# Patient Record
Sex: Female | Born: 1980 | Race: Black or African American | Hispanic: No | Marital: Married | Smoking: Never smoker
Health system: Southern US, Community
[De-identification: ages and names within clinical notes are randomized; demographics above are authoritative.]

## PROBLEM LIST (undated history)

## (undated) ENCOUNTER — Emergency Department (HOSPITAL_COMMUNITY): Admission: EM | Payer: Self-pay | Source: Home / Self Care

## (undated) DIAGNOSIS — K5909 Other constipation: Secondary | ICD-10-CM

## (undated) DIAGNOSIS — K219 Gastro-esophageal reflux disease without esophagitis: Secondary | ICD-10-CM

## (undated) DIAGNOSIS — G43909 Migraine, unspecified, not intractable, without status migrainosus: Secondary | ICD-10-CM

## (undated) DIAGNOSIS — R1310 Dysphagia, unspecified: Secondary | ICD-10-CM

## (undated) DIAGNOSIS — H18603 Keratoconus, unspecified, bilateral: Secondary | ICD-10-CM

## (undated) HISTORY — PX: UTERINE FIBROID EMBOLIZATION: SHX825

## (undated) HISTORY — PX: CHOLECYSTECTOMY: SHX55

## (undated) HISTORY — PX: REDUCTION MAMMAPLASTY: SUR839

## (undated) HISTORY — DX: Dysphagia, unspecified: R13.10

## (undated) HISTORY — PX: ANKLE SURGERY: SHX546

## (undated) HISTORY — PX: HERNIA REPAIR: SHX51

## (undated) HISTORY — DX: Other constipation: K59.09

## (undated) HISTORY — DX: Keratoconus, unspecified, bilateral: H18.603

## (undated) HISTORY — PX: ABDOMINAL HYSTERECTOMY: SHX81

---

## 2011-04-25 ENCOUNTER — Encounter: Payer: Self-pay | Admitting: Emergency Medicine

## 2011-04-25 ENCOUNTER — Emergency Department (HOSPITAL_COMMUNITY)
Admission: EM | Admit: 2011-04-25 | Discharge: 2011-04-25 | Disposition: A | Discharge status: Home / Self Care | Payer: Medicaid Other | Attending: Emergency Medicine | Admitting: Emergency Medicine

## 2011-04-25 DIAGNOSIS — R05 Cough: Secondary | ICD-10-CM | POA: Insufficient documentation

## 2011-04-25 DIAGNOSIS — J111 Influenza due to unidentified influenza virus with other respiratory manifestations: Secondary | ICD-10-CM

## 2011-04-25 DIAGNOSIS — R509 Fever, unspecified: Secondary | ICD-10-CM | POA: Insufficient documentation

## 2011-04-25 DIAGNOSIS — R059 Cough, unspecified: Secondary | ICD-10-CM | POA: Insufficient documentation

## 2011-04-25 DIAGNOSIS — IMO0001 Reserved for inherently not codable concepts without codable children: Secondary | ICD-10-CM | POA: Insufficient documentation

## 2011-04-25 DIAGNOSIS — R6889 Other general symptoms and signs: Secondary | ICD-10-CM | POA: Insufficient documentation

## 2011-04-25 MED ORDER — GUAIFENESIN ER 1200 MG PO TB12: 1.0000 | ORAL_TABLET | Freq: Two times a day (BID) | ORAL | Status: DC

## 2011-04-25 MED ORDER — IBUPROFEN 800 MG PO TABS: 800.0000 mg | ORAL_TABLET | Freq: Three times a day (TID) | ORAL | Status: AC | PRN

## 2011-04-25 MED ORDER — PROMETHAZINE-DM 6.25-15 MG/5ML PO SYRP: 5.0000 mL | ORAL_SOLUTION | Freq: Four times a day (QID) | ORAL | Status: AC | PRN

## 2011-04-25 NOTE — ED Notes (Signed)
No complaints at present. Voices understanding of instructions given. Walked to check out window.  

## 2011-04-25 NOTE — ED Provider Notes (Signed)
Medical screening examination/treatment/procedure(s) were performed by non-physician practitioner and as supervising physician I was immediately available for consultation/collaboration.   Glyn Zendejas M Berklee Battey, MD 04/25/11 1657 

## 2011-04-25 NOTE — ED Provider Notes (Signed)
History     CSN: [id number] Arrival date & time: 04/25/2011 12:23 PM   First MD Initiated Contact with Patient 04/25/11 1235      No chief complaint on file.   (Consider location/radiation/quality/duration/timing/severity/associated sxs/prior treatment) The history is provided by the patient.   The patient presents with a two-day history of cough, runny nose, nasal congestion, fever and bodyaches.  She denies chest pain, shortness of breath, nausea, vomiting, diarrhea or headache the patient, states she has not tried any medications other than over-the-counter decongestants, which did not seem to help.  History reviewed. No pertinent past medical history.  Past Surgical History  Procedure Date  . Cholecystectomy   . Hernia repair     No family history on file.  History  Substance Use Topics  . Smoking status: Never Smoker   . Smokeless tobacco: Not on file  . Alcohol Use: No    OB History    Grav Para Term Preterm Abortions TAB SAB Ect Mult Living                  Review of Systems All pertinent positives and negatives in the history of present illness  Allergies  Review of patient's allergies indicates no known allergies.  Home Medications  No current outpatient prescriptions on file.  BP 103/66  Pulse 7  Temp(Src) 99.1 F (37.3 C) (Oral)  Resp 22  Wt 168 lb (76.204 kg)  SpO2 98%  LMP 04/02/2011  Physical Exam  Constitutional: She is oriented to person, place, and time. She appears well-developed and well-nourished.  HENT:  Head: Normocephalic and atraumatic.  Eyes: Pupils are equal, round, and reactive to light.  Neck: Normal range of motion. Neck supple.  Cardiovascular: Normal rate, regular rhythm and normal heart sounds.  Exam reveals no gallop and no friction rub.   No murmur heard. Pulmonary/Chest: Effort normal and breath sounds normal. No respiratory distress. She has no wheezes. She has no rales. She exhibits no tenderness.  Abdominal: Soft.  Bowel sounds are normal. There is no tenderness. There is no rebound and no guarding.  Neurological: She is alert and oriented to person, place, and time.  Skin: Skin is warm and dry. No rash noted.    ED Course  Procedures (including critical care time)   Patient has, what appears to be an influenza-like illness based on history of present illness and physical exam findings.  The patients  Daughter is in a daycare and came home with an influenza-like illness last week.  She states, that she has been taking decongestants without help of her symptoms.        MDM  Influenza-like illness.        Carlyle Dolly, PA-C 04/25/11 [phone]

## 2011-10-10 ENCOUNTER — Encounter (HOSPITAL_COMMUNITY): Payer: Self-pay | Admitting: Emergency Medicine

## 2011-10-10 ENCOUNTER — Emergency Department (HOSPITAL_COMMUNITY)
Admission: EM | Admit: 2011-10-10 | Discharge: 2011-10-10 | Disposition: A | Discharge status: Home / Self Care | Payer: Medicaid Other | Attending: Emergency Medicine | Admitting: Emergency Medicine

## 2011-10-10 DIAGNOSIS — N730 Acute parametritis and pelvic cellulitis: Secondary | ICD-10-CM

## 2011-10-10 DIAGNOSIS — N949 Unspecified condition associated with female genital organs and menstrual cycle: Secondary | ICD-10-CM | POA: Insufficient documentation

## 2011-10-10 DIAGNOSIS — N739 Female pelvic inflammatory disease, unspecified: Secondary | ICD-10-CM | POA: Insufficient documentation

## 2011-10-10 LAB — PREGNANCY, URINE: Preg Test, Ur: NEGATIVE

## 2011-10-10 LAB — URINALYSIS, ROUTINE W REFLEX MICROSCOPIC
Bilirubin Urine: NEGATIVE
Ketones, ur: NEGATIVE mg/dL
Nitrite: NEGATIVE
Protein, ur: NEGATIVE mg/dL
Urobilinogen, UA: 1 mg/dL (ref 0.0–1.0)
pH: 6 (ref 5.0–8.0)

## 2011-10-10 LAB — WET PREP, GENITAL
Trich, Wet Prep: NONE SEEN
Yeast Wet Prep HPF POC: NONE SEEN

## 2011-10-10 LAB — URINE MICROSCOPIC-ADD ON

## 2011-10-10 MED ORDER — DOXYCYCLINE HYCLATE 100 MG PO CAPS: 100.0000 mg | ORAL_CAPSULE | Freq: Two times a day (BID) | ORAL | Status: AC

## 2011-10-10 MED ORDER — LIDOCAINE HCL 1 % IJ SOLN
INTRAMUSCULAR | Status: AC
  Administered 2011-10-10: 20 mL
  Filled 2011-10-10: qty 20

## 2011-10-10 MED ORDER — CEFTRIAXONE SODIUM 250 MG IJ SOLR
250.0000 mg | INTRAMUSCULAR | Status: DC
  Administered 2011-10-10: 250 mg via INTRAMUSCULAR
  Filled 2011-10-10: qty 250

## 2011-10-10 MED ORDER — AZITHROMYCIN 250 MG PO TABS
1000.0000 mg | ORAL_TABLET | Freq: Once | ORAL | Status: AC
  Administered 2011-10-10: 1000 mg via ORAL
  Filled 2011-10-10: qty 4

## 2011-10-10 NOTE — ED Notes (Signed)
C/o vaginal irritation, itching, little bit of discharge- white--- was on antibiotics last week.

## 2011-10-10 NOTE — ED Provider Notes (Signed)
History     CSN: [id number]  Arrival date & time 10/10/11  4098   First MD Initiated Contact with Patient 10/10/11 1010      Chief Complaint  Patient presents with  . Vaginal Discharge  . vaginal irritation     (Consider location/radiation/quality/duration/timing/severity/associated sxs/prior treatment) Patient is a 31 y.o. female presenting with vaginal discharge. The history is provided by the patient. No language interpreter was used.  Vaginal Discharge This is a new problem. The current episode started yesterday. The problem occurs constantly. The problem has been gradually worsening. Pertinent negatives include no abdominal pain, fever, nausea or vomiting.    History reviewed. No pertinent past medical history.  Past Surgical History  Procedure Date  . Cholecystectomy   . Hernia repair     No family history on file.  History  Substance Use Topics  . Smoking status: Never Smoker   . Smokeless tobacco: Not on file  . Alcohol Use: No    OB History    Grav Para Term Preterm Abortions TAB SAB Ect Mult Living                  Review of Systems  Constitutional: Negative.  Negative for fever.  HENT: Negative.   Eyes: Negative.   Respiratory: Negative.   Cardiovascular: Negative.   Gastrointestinal: Negative.  Negative for nausea, vomiting and abdominal pain.  Genitourinary: Positive for vaginal discharge and pelvic pain. Negative for dysuria, urgency, flank pain and vaginal bleeding.  Neurological: Negative.   Psychiatric/Behavioral: Negative.   All other systems reviewed and are negative.    Allergies  Review of patient's allergies indicates no known allergies.  Home Medications   Current Outpatient Rx  Name Route Sig Dispense Refill  . DOXYCYCLINE HYCLATE 100 MG PO CAPS Oral Take 1 capsule (100 mg total) by mouth 2 (two) times daily. 20 capsule 0    BP 108/64  Pulse 90  Temp(Src) 98.7 F (37.1 C) (Oral)  Resp 18  Ht 5\' 5"  (1.651 m)  Wt 159 lb  (72.122 kg)  BMI 26.46 kg/m2  SpO2 100%  LMP 09/26/2011  Physical Exam  Nursing note and vitals reviewed. Constitutional: She is oriented to person, place, and time. She appears well-developed and well-nourished.  HENT:  Head: Normocephalic and atraumatic.  Eyes: Conjunctivae and EOM are normal. Pupils are equal, round, and reactive to light.  Neck: Normal range of motion. Neck supple.  Cardiovascular: Normal rate.   Pulmonary/Chest: Effort normal.  Abdominal: Soft.  Genitourinary: Uterus normal. Cervix exhibits motion tenderness, discharge and friability. Right adnexum displays no mass, no tenderness and no fullness. Left adnexum displays no mass, no tenderness and no fullness. No erythema around the vagina. Vaginal discharge found.  Musculoskeletal: Normal range of motion. She exhibits no edema and no tenderness.  Neurological: She is alert and oriented to person, place, and time. She has normal reflexes.  Skin: Skin is warm and dry.  Psychiatric: She has a normal mood and affect.    ED Course  Pelvic exam Date/Time: 10/10/2011 11:21 AM Performed by: Remi Haggard Authorized by: Remi Haggard Consent: Verbal consent obtained. Written consent not obtained. Risks and benefits: risks, benefits and alternatives were discussed Consent given by: patient Patient understanding: patient states understanding of the procedure being performed Patient identity confirmed: verbally with patient, arm band, provided demographic data and hospital-assigned identification number Time out: Immediately prior to procedure a "time out" was called to verify the correct patient, procedure, equipment, support staff and  site/side marked as required. Preparation: Patient was prepped and draped in the usual sterile fashion. Local anesthesia used: no Patient sedated: no Patient tolerance: Patient tolerated the procedure well with no immediate complications.   (including critical care time)  Labs Reviewed   WET PREP, GENITAL - Abnormal; Notable for the following:    Clue Cells Wet Prep HPF POC MODERATE (*)    WBC, Wet Prep HPF POC MANY (*)    All other components within normal limits  URINALYSIS, ROUTINE W REFLEX MICROSCOPIC - Abnormal; Notable for the following:    Color, Urine AMBER (*) BIOCHEMICALS MAY BE AFFECTED BY COLOR   Leukocytes, UA SMALL (*)    All other components within normal limits  URINE MICROSCOPIC-ADD ON - Abnormal; Notable for the following:    Squamous Epithelial / LPF FEW (*)    Bacteria, UA FEW (*)    All other components within normal limits  PREGNANCY, URINE  GC/CHLAMYDIA PROBE AMP, GENITAL   No results found.   1. PID (acute pelvic inflammatory disease)       MDM  Treated for PID in the ER with rocephen and azithromycin.  Rx for doxycycline.  No intercourse until rechecked at [facility] clinic.  Partner needs to be checked as well.  No etoh. Return if nv fever.        Remi Haggard, NP 10/10/11 2023

## 2011-10-10 NOTE — Discharge Instructions (Signed)
Ms Goding we treated you in the ER today for pelvic inflamitory disease.  [facility]  Get the doxycyline filled and start taking today.  Take ibuprofen 800mg  with food for pain.  Return to the ER for severe pain, nausea vomiting or high fever.  Pelvic Inflammatory Disease Pelvic inflammatory disease (PID) is an infection of some, or all, of the female pelvic organs. PID is caused by germs. HOME CARE  Take your medicine as told. Finish them even if you start to feel better.   Only take medicine as told by your doctor.   Do not have sex (intercourse) until the infection is gone.   Tell your sex partner you have PID. Treatment may be needed.   Keep your follow-up appointments.  GET HELP RIGHT AWAY IF:   You have a fever.   You have an increase in belly (abdominal) pain.   You start to get the chills.   You have pain when you pee (urinate).   You are not better after 72 hours.   Your sex partner tells you he or she has an STD.   You throw up (vomit).   You cannot take your medicines.  MAKE SURE YOU:   Understand these instructions.   Will watch your condition.   Will get help right away if you are not doing well or get worse.  Document Released: 08/05/2008 Document Revised: 04/28/2011 Document Reviewed: 09/08/2009 [facility] Patient Information 2012 [place], [place].

## 2011-10-11 NOTE — ED Provider Notes (Signed)
Medical screening examination/treatment/procedure(s) were performed by non-physician practitioner and as supervising physician I was immediately available for consultation/collaboration.   Lyanne Co, MD 10/11/11 1323

## 2012-01-27 ENCOUNTER — Encounter (HOSPITAL_COMMUNITY): Payer: Self-pay | Admitting: Family Medicine

## 2012-01-27 ENCOUNTER — Emergency Department (HOSPITAL_COMMUNITY)
Admission: EM | Admit: 2012-01-27 | Discharge: 2012-01-28 | Disposition: A | Discharge status: Home / Self Care | Payer: Medicaid Other | Attending: Emergency Medicine | Admitting: Emergency Medicine

## 2012-01-27 DIAGNOSIS — R509 Fever, unspecified: Secondary | ICD-10-CM | POA: Insufficient documentation

## 2012-01-27 DIAGNOSIS — N719 Inflammatory disease of uterus, unspecified: Secondary | ICD-10-CM

## 2012-01-27 DIAGNOSIS — Z9889 Other specified postprocedural states: Secondary | ICD-10-CM | POA: Insufficient documentation

## 2012-01-27 DIAGNOSIS — Z9089 Acquired absence of other organs: Secondary | ICD-10-CM | POA: Insufficient documentation

## 2012-01-27 DIAGNOSIS — D259 Leiomyoma of uterus, unspecified: Secondary | ICD-10-CM | POA: Insufficient documentation

## 2012-01-27 LAB — URINE MICROSCOPIC-ADD ON

## 2012-01-27 LAB — COMPREHENSIVE METABOLIC PANEL
Alkaline Phosphatase: 69 U/L (ref 39–117)
BUN: 7 mg/dL (ref 6–23)
Chloride: 98 mEq/L (ref 96–112)
Creatinine, Ser: 0.65 mg/dL (ref 0.50–1.10)
GFR calc Af Amer: >90 mL/min (ref >90)
Glucose, Bld: 98 mg/dL (ref 70–99)
Potassium: 3.6 mEq/L (ref 3.5–5.1)
Total Bilirubin: 0.2 mg/dL — ABNORMAL LOW (ref 0.3–1.2)

## 2012-01-27 LAB — URINALYSIS, ROUTINE W REFLEX MICROSCOPIC
Nitrite: NEGATIVE
Protein, ur: NEGATIVE mg/dL
Urobilinogen, UA: 2 mg/dL — ABNORMAL HIGH (ref 0.0–1.0)

## 2012-01-27 LAB — CBC WITH DIFFERENTIAL/PLATELET
Eosinophils Absolute: 0 10*3/uL (ref 0.0–0.7)
HCT: 31.4 % — ABNORMAL LOW (ref 36.0–46.0)
Hemoglobin: 10.4 g/dL — ABNORMAL LOW (ref 12.0–15.0)
Lymphs Abs: 1.9 10*3/uL (ref 0.7–4.0)
MCH: 26.9 pg (ref 26.0–34.0)
MCHC: 33.1 g/dL (ref 30.0–36.0)
Monocytes Absolute: 0.8 10*3/uL (ref 0.1–1.0)
Monocytes Relative: 11 % (ref 3–12)
Neutrophils Relative %: 62 % (ref 43–77)
RBC: 3.86 MIL/uL — ABNORMAL LOW (ref 3.87–5.11)

## 2012-01-27 LAB — POCT PREGNANCY, URINE: Preg Test, Ur: NEGATIVE

## 2012-01-27 MED ORDER — SODIUM CHLORIDE 0.9 % IV SOLN
Freq: Once | INTRAVENOUS | Status: AC
  Administered 2012-01-27: 23:00:00 via INTRAVENOUS

## 2012-01-27 MED ORDER — ACETAMINOPHEN 325 MG PO TABS
ORAL_TABLET | ORAL | Status: AC
  Filled 2012-01-27: qty 2

## 2012-01-27 MED ORDER — ACETAMINOPHEN 325 MG PO TABS
650.0000 mg | ORAL_TABLET | Freq: Once | ORAL | Status: AC
Start: 2012-01-27 — End: 2012-01-27
  Administered 2012-01-27: 650 mg via ORAL

## 2012-01-27 NOTE — ED Notes (Signed)
Patient states she has had vaginal bleeding, fevers, and chills since she had a fibroid embolization last week.

## 2012-01-27 NOTE — ED Provider Notes (Signed)
History     CSN: [id number]  Arrival date & time 01/27/12  2147   First MD Initiated Contact with Patient 01/27/12 2247      Chief Complaint  Patient presents with  . Fever  . Vaginal Bleeding    (Consider location/radiation/quality/duration/timing/severity/associated sxs/prior treatment) HPI History provided by pt.   Pt had fibro-embolization approx 10d ago at [facility].  Developed fever 2 days later and had persistent vaginal bleeding and constipation.  Was evaluated at [facility] ED, had a CT (pt unsure of results), diagnosed w/ UTI and d/c'd home w/ miralax.  Was not given abx for UTI.  Comes to ED today because no relief w/ miralax, has not had a BM in 2 weeks, persistent, heavy vaginal bleeding and f/c, max temp 102.  Has mid-line lower abdominal pain as well as pain across lower back.  Has had lightheadedness and weakness in her legs.  No other GU sx.  Other abd surgeries include cholecystectomy and hernia repair.    History reviewed. No pertinent past medical history.  Past Surgical History  Procedure Date  . Cholecystectomy   . Hernia repair   . Uterine fibroid embolization     No family history on file.  History  Substance Use Topics  . Smoking status: Never Smoker   . Smokeless tobacco: Not on file  . Alcohol Use: No    OB History    Grav Para Term Preterm Abortions TAB SAB Ect Mult Living                  Review of Systems  All other systems reviewed and are negative.    Allergies  Review of patient's allergies indicates no known allergies.  Home Medications   Current Outpatient Rx  Name Route Sig Dispense Refill  . IBUPROFEN 800 MG PO TABS Oral Take 800 mg by mouth every 8 (eight) hours as needed.    . OXYCODONE-ACETAMINOPHEN 5-325 MG PO TABS Oral Take 1 tablet by mouth every 4 (four) hours as needed. For pain    . POLYETHYLENE GLYCOL 3350 PO PACK Oral Take 17 g by mouth daily.      BP 115/77  Pulse 98  Temp 102.5 F (39.2 C) (Oral)  Resp 20  Ht 5'  5" (1.651 m)  Wt 145 lb (65.772 kg)  BMI 24.13 kg/m2  SpO2 99%  LMP 01/10/2012  Physical Exam  Nursing note and vitals reviewed. Constitutional: She is oriented to person, place, and time. She appears well-developed and well-nourished. No distress.  HENT:  Head: Normocephalic and atraumatic.  Eyes:       Normal appearance  Neck: Normal range of motion.  Cardiovascular: Normal rate and regular rhythm.   Pulmonary/Chest: Effort normal and breath sounds normal. No respiratory distress.  Abdominal: Soft. Bowel sounds are normal. She exhibits no distension and no mass. There is no rebound and no guarding.       Diffuse lower abd ttp.    Genitourinary:       No CVA tenderness.  Nml external genitalia.  Vaginal bleeding.  Cervix closed and appears nml.  No cervical motion tenderess.  R adnexal ttp.   Musculoskeletal: Normal range of motion.  Neurological: She is alert and oriented to person, place, and time.  Skin: Skin is warm and dry. No rash noted.  Psychiatric: She has a normal mood and affect. Her behavior is normal.    ED Course  Procedures (including critical care time)  Labs Reviewed  CBC WITH  DIFFERENTIAL - Abnormal; Notable for the following:    RBC 3.86 (*)     Hemoglobin 10.4 (*)     HCT 31.4 (*)     Platelets 500 (*)     All other components within normal limits  URINALYSIS, ROUTINE W REFLEX MICROSCOPIC - Abnormal; Notable for the following:    APPearance CLOUDY (*)     Hgb urine dipstick MODERATE (*)     Bilirubin Urine SMALL (*)     Ketones, ur TRACE (*)     Urobilinogen, UA 2.0 (*)     Leukocytes, UA SMALL (*)     All other components within normal limits  URINE MICROSCOPIC-ADD ON - Abnormal; Notable for the following:    Squamous Epithelial / LPF FEW (*)     All other components within normal limits  POCT PREGNANCY, URINE  COMPREHENSIVE METABOLIC PANEL  CULTURE, BLOOD (ROUTINE X 2)  CULTURE, BLOOD (ROUTINE X 2)  LACTIC ACID, PLASMA  WET PREP, GENITAL    GC/CHLAMYDIA PROBE AMP, GENITAL   US Transvaginal Non-ob  01/28/2012  *RADIOLOGY REPORT*  Clinical Data: Abdominal pain and heavy bleeding.  Uterine fibroid embolization procedure 16 days ago.  TRANSABDOMINAL AND TRANSVAGINAL ULTRASOUND OF PELVIS Technique:  Both transabdominal and transvaginal ultrasound examinations of the pelvis were performed. Transabdominal technique was performed for global imaging of the pelvis including uterus, ovaries, adnexal regions, and pelvic cul-de-sac.  It was necessary to proceed with endovaginal exam following the transabdominal exam to visualize the uterus and endometrium.  Comparison:  None  Findings:  Technically limited study.  Ovaries are only visualized transabdominally.  Limited visualization of the uterus and vaginally.  Uterus: The uterus is anteverted and measures 14.1 cm length. There is a mass in the region of the upper uterine segment bulging the contour of the uterus and measuring about 9.1 x 0.8 cm.  The mass is homogeneously hyperechoic with central calcification.  This is likely to represent a fibroid.  Endometrium: Limited visualization of the endometrium.  The stripe thickness is measured about 1 mm.  However, in the uterine fundus, there appears to be an ovoid fluid collection within the endometrium.  This could represent hemorrhage, endometrial polyp, or inflammatory or obstructive process. Clinical correlation is recommended.  If additional evaluation is warranted, consider hysterosonogram.  Right ovary:  Right ovary measures 4.7 x 2.9 x 3.2 cm.  Normal follicular changes.  No abnormal adnexal masses.  Left ovary: Left ovary measures 3.5 x 2.5 x 1.8 cm.  Normal follicular changes.  No abnormal adnexal masses.  Other findings: Flow is demonstrated within both ovaries on limited color flow Doppler evaluation.  No free pelvic fluid collections.  IMPRESSION: Large uterine fibroid.  Fluid in the endometrium of nonspecific etiology.  Clinical correlation is  recommended for further evaluation.  Normal appearance of the ovaries.   Original Report Authenticated By: Marlon Pel, M.D.    US Pelvis Complete  01/28/2012  *RADIOLOGY REPORT*  Clinical Data: Abdominal pain and heavy bleeding.  Uterine fibroid embolization procedure 16 days ago.  TRANSABDOMINAL AND TRANSVAGINAL ULTRASOUND OF PELVIS Technique:  Both transabdominal and transvaginal ultrasound examinations of the pelvis were performed. Transabdominal technique was performed for global imaging of the pelvis including uterus, ovaries, adnexal regions, and pelvic cul-de-sac.  It was necessary to proceed with endovaginal exam following the transabdominal exam to visualize the uterus and endometrium.  Comparison:  None  Findings:  Technically limited study.  Ovaries are only visualized transabdominally.  Limited visualization of  the uterus and vaginally.  Uterus: The uterus is anteverted and measures 14.1 cm length. There is a mass in the region of the upper uterine segment bulging the contour of the uterus and measuring about 9.1 x 0.8 cm.  The mass is homogeneously hyperechoic with central calcification.  This is likely to represent a fibroid.  Endometrium: Limited visualization of the endometrium.  The stripe thickness is measured about 1 mm.  However, in the uterine fundus, there appears to be an ovoid fluid collection within the endometrium.  This could represent hemorrhage, endometrial polyp, or inflammatory or obstructive process. Clinical correlation is recommended.  If additional evaluation is warranted, consider hysterosonogram.  Right ovary:  Right ovary measures 4.7 x 2.9 x 3.2 cm.  Normal follicular changes.  No abnormal adnexal masses.  Left ovary: Left ovary measures 3.5 x 2.5 x 1.8 cm.  Normal follicular changes.  No abnormal adnexal masses.  Other findings: Flow is demonstrated within both ovaries on limited color flow Doppler evaluation.  No free pelvic fluid collections.  IMPRESSION: Large  uterine fibroid.  Fluid in the endometrium of nonspecific etiology.  Clinical correlation is recommended for further evaluation.  Normal appearance of the ovaries.   Original Report Authenticated By: Marlon Pel, M.D.      1. Endometritis       MDM  31yo F pt underwent uterine embolization for uterine fibroids 2 weeks ago.  Evaluated at [facility] ER 8/31 for abd pain and heavy vaginal bleeding.  CT abd/pelvis reviewed and findings consistent w/ changes related to embolization, but superimposed infection could not be ruled out.  Also showed enlarged, cystic ovaries. Pt comes to ED today d/t persistent pain, bleeding and constipation.  On exam, febrile, lower abd ttp, vaginal bleeding and R adnexal ttp.  Labs unremarkable.  US shows lg uterine fibroid, fluid in endometrium and nml ovaries.  Likely has iatrogenic endometritis.  She has received IV vanc/zosyn to treat empirically for intra-abd infection.  Consulted Dr. Oneida Arenas in [facility] and he recommends that she be prescribed doxycycline and f/u with her Gyn on Monday.  Pt received bisacodyl PR and lactulose po in ED and had had a BM.  She has miralax at home. Return precautions discussed.         Otilio Miu, [place] 01/28/12 [phone]

## 2012-01-28 ENCOUNTER — Emergency Department (HOSPITAL_COMMUNITY): Payer: Medicaid Other

## 2012-01-28 LAB — GC/CHLAMYDIA PROBE AMP, GENITAL
Chlamydia, DNA Probe: NEGATIVE
GC Probe Amp, Genital: NEGATIVE

## 2012-01-28 LAB — WET PREP, GENITAL: Clue Cells Wet Prep HPF POC: NONE SEEN

## 2012-01-28 MED ORDER — MORPHINE SULFATE 4 MG/ML IJ SOLN
4.0000 mg | Freq: Once | INTRAMUSCULAR | Status: AC
  Administered 2012-01-28: 4 mg via INTRAVENOUS
  Filled 2012-01-28: qty 1

## 2012-01-28 MED ORDER — BISACODYL 10 MG RE SUPP
10.0000 mg | Freq: Once | RECTAL | Status: AC
  Administered 2012-01-28: 10 mg via RECTAL
  Filled 2012-01-28: qty 1

## 2012-01-28 MED ORDER — DOXYCYCLINE HYCLATE 100 MG PO CAPS: 100.0000 mg | ORAL_CAPSULE | Freq: Two times a day (BID) | ORAL | Status: AC

## 2012-01-28 MED ORDER — VANCOMYCIN HCL IN DEXTROSE 1-5 GM/200ML-% IV SOLN
1000.0000 mg | Freq: Once | INTRAVENOUS | Status: AC
  Administered 2012-01-28: 1000 mg via INTRAVENOUS
  Filled 2012-01-28: qty 200

## 2012-01-28 MED ORDER — PIPERACILLIN-TAZOBACTAM 3.375 G IVPB
3.3750 g | Freq: Once | INTRAVENOUS | Status: AC
  Administered 2012-01-28: 3.375 g via INTRAVENOUS
  Filled 2012-01-28: qty 50

## 2012-01-28 MED ORDER — LACTULOSE 10 GM/15ML PO SOLN
20.0000 g | Freq: Once | ORAL | Status: AC
  Administered 2012-01-28: 20 g via ORAL
  Filled 2012-01-28: qty 30

## 2012-01-28 NOTE — ED Provider Notes (Signed)
Medical screening examination/treatment/procedure(s) were conducted as a shared visit with non-physician practitioner(s) and myself.  I personally evaluated the patient during the encounter. Fever and pelvic pain left sided. Old records reviewed. Imaging reviewed. D/w GYN and plan ABx to cover endometritis  Cyanna Neace, MD 01/28/12 2323 

## 2012-01-28 NOTE — ED Notes (Signed)
Patient awaiting ultrasound

## 2012-01-28 NOTE — ED Notes (Signed)
[place], MD at bedside. 

## 2012-01-28 NOTE — ED Notes (Signed)
Patient transported to Ultrasound 

## 2012-01-28 NOTE — ED Provider Notes (Signed)
Medical screening examination/treatment/procedure(s) were conducted as a shared visit with non-physician practitioner(s) and myself.  I personally evaluated the patient during the encounter. Fever and pelvic pain left sided. Old records reviewed. Imaging reviewed. D/w GYN and plan ABx to cover endometritis  Sunnie Nielsen, MD 01/28/12 5621

## 2012-01-28 NOTE — ED Provider Notes (Signed)
7:22 AM Assumed care of patient at change of shift.  Sign out received from [employer], PA-C. Pt with endometritis, IV antibiotics were pending at change of shift, now finished.  Pt reports she is feeling well, ready to go home.  Plan is for d/c home on doxycycline with gyn follow up (Dr Shawnie Pons in [facility]). Pt declines pain or nausea medication for home.    [place], [place] 01/28/12 [phone]

## 2012-01-28 NOTE — ED Notes (Signed)
Returned from ultrasound.

## 2012-01-28 NOTE — ED Notes (Signed)
Patient states that she had uterine fibroid embolization at [facility] last week. States that she has been running fevers, having chills and having vaginal bleeding since that time.

## 2012-02-03 LAB — CULTURE, BLOOD (ROUTINE X 2): Culture: NO GROWTH

## 2013-12-16 ENCOUNTER — Encounter (HOSPITAL_COMMUNITY): Payer: Self-pay | Admitting: Emergency Medicine

## 2013-12-16 ENCOUNTER — Emergency Department (HOSPITAL_COMMUNITY): Payer: Medicaid Other

## 2013-12-16 ENCOUNTER — Emergency Department (HOSPITAL_COMMUNITY)
Admission: EM | Admit: 2013-12-16 | Discharge: 2013-12-16 | Disposition: A | Discharge status: Home / Self Care | Payer: Medicaid Other | Attending: Emergency Medicine | Admitting: Emergency Medicine

## 2013-12-16 DIAGNOSIS — W108XXA Fall (on) (from) other stairs and steps, initial encounter: Secondary | ICD-10-CM | POA: Insufficient documentation

## 2013-12-16 DIAGNOSIS — M25511 Pain in right shoulder: Secondary | ICD-10-CM

## 2013-12-16 DIAGNOSIS — Y9389 Activity, other specified: Secondary | ICD-10-CM | POA: Diagnosis not present

## 2013-12-16 DIAGNOSIS — R519 Headache, unspecified: Secondary | ICD-10-CM

## 2013-12-16 DIAGNOSIS — Y929 Unspecified place or not applicable: Secondary | ICD-10-CM | POA: Diagnosis not present

## 2013-12-16 DIAGNOSIS — S46909A Unspecified injury of unspecified muscle, fascia and tendon at shoulder and upper arm level, unspecified arm, initial encounter: Secondary | ICD-10-CM | POA: Diagnosis not present

## 2013-12-16 DIAGNOSIS — S0993XA Unspecified injury of face, initial encounter: Secondary | ICD-10-CM | POA: Insufficient documentation

## 2013-12-16 DIAGNOSIS — M62838 Other muscle spasm: Secondary | ICD-10-CM

## 2013-12-16 DIAGNOSIS — S4980XA Other specified injuries of shoulder and upper arm, unspecified arm, initial encounter: Secondary | ICD-10-CM | POA: Insufficient documentation

## 2013-12-16 DIAGNOSIS — S0990XA Unspecified injury of head, initial encounter: Secondary | ICD-10-CM | POA: Diagnosis not present

## 2013-12-16 DIAGNOSIS — S199XXA Unspecified injury of neck, initial encounter: Secondary | ICD-10-CM | POA: Diagnosis not present

## 2013-12-16 DIAGNOSIS — R51 Headache: Secondary | ICD-10-CM

## 2013-12-16 MED ORDER — DIPHENHYDRAMINE HCL 50 MG/ML IJ SOLN: 25.0000 mg | Freq: Once | INTRAMUSCULAR | Status: DC

## 2013-12-16 MED ORDER — SODIUM CHLORIDE 0.9 % IV BOLUS (SEPSIS): 1000.0000 mL | Freq: Once | INTRAVENOUS | Status: DC

## 2013-12-16 MED ORDER — DIPHENHYDRAMINE HCL 25 MG PO CAPS
25.0000 mg | ORAL_CAPSULE | Freq: Once | ORAL | Status: AC
Start: 2013-12-16 — End: 2013-12-16
  Administered 2013-12-16: 25 mg via ORAL
  Filled 2013-12-16: qty 1

## 2013-12-16 MED ORDER — OXYCODONE-ACETAMINOPHEN 5-325 MG PO TABS: 1.0000 | ORAL_TABLET | Freq: Four times a day (QID) | ORAL | Status: DC | PRN

## 2013-12-16 MED ORDER — DIAZEPAM 5 MG/ML IJ SOLN
2.5000 mg | Freq: Once | INTRAMUSCULAR | Status: DC
  Filled 2013-12-16: qty 2

## 2013-12-16 MED ORDER — METOCLOPRAMIDE HCL 5 MG/ML IJ SOLN
10.0000 mg | Freq: Once | INTRAMUSCULAR | Status: DC
  Filled 2013-12-16: qty 2

## 2013-12-16 MED ORDER — KETOROLAC TROMETHAMINE 30 MG/ML IJ SOLN
30.0000 mg | Freq: Once | INTRAMUSCULAR | Status: DC
  Filled 2013-12-16: qty 1

## 2013-12-16 MED ORDER — KETOROLAC TROMETHAMINE 30 MG/ML IJ SOLN
30.0000 mg | Freq: Once | INTRAMUSCULAR | Status: AC
  Administered 2013-12-16: 30 mg via INTRAMUSCULAR

## 2013-12-16 MED ORDER — DEXAMETHASONE SODIUM PHOSPHATE 10 MG/ML IJ SOLN
10.0000 mg | Freq: Once | INTRAMUSCULAR | Status: DC
  Filled 2013-12-16: qty 1

## 2013-12-16 MED ORDER — DIAZEPAM 5 MG PO TABS
5.0000 mg | ORAL_TABLET | Freq: Once | ORAL | Status: AC
  Administered 2013-12-16: 5 mg via ORAL
  Filled 2013-12-16: qty 1

## 2013-12-16 MED ORDER — DEXAMETHASONE SODIUM PHOSPHATE 10 MG/ML IJ SOLN
10.0000 mg | Freq: Once | INTRAMUSCULAR | Status: AC
  Administered 2013-12-16: 10 mg via INTRAMUSCULAR

## 2013-12-16 MED ORDER — METOCLOPRAMIDE HCL 10 MG PO TABS
10.0000 mg | ORAL_TABLET | Freq: Once | ORAL | Status: AC
  Administered 2013-12-16: 10 mg via ORAL
  Filled 2013-12-16: qty 1

## 2013-12-16 NOTE — ED Provider Notes (Signed)
CSN: [id number]     Arrival date & time 12/16/13  1120 History   First MD Initiated Contact with Patient 12/16/13 1148     Chief Complaint  Patient presents with  . Headache  . right sided pain   . Fall   HPI Comments: Patient is a 33 y.o. Female who presents to the ED with headache and right neck and shoulder pain.  Patient states that approximately five days ago she fell down the stairs after hitting her head on the stair banister.  Patient states that she did not lose consciousness when she fell.  She states that she fell because she was really weak after her hernia surgery which was performed on the 20th.  Patient describes her headache as generalized and throbbing.  Headache is a 7/10 pain which does not radiate.  Patient denies any visual changes, photophobia, numbness, tingling, weakness, syncope, changes in hearing, abdominal pain, back pain, loss of bowel or bladder, saddle anesthesia.  Patient states that she has a constant aching pain in her neck, trapezius, and right shoulder.  Pain does not radiate down the arm.  She has been able to ambulate since then.  She has been taking desaryl, hydrocodone, and ibuprofen with no relief.  She denies any history of IV drug use.  Patient is a 33 y.o. female presenting with headaches and fall. The history is provided by the patient. No language interpreter was used.  Headache Associated symptoms: myalgias, nausea, neck pain and neck stiffness   Associated symptoms: no abdominal pain, no back pain, no diarrhea, no dizziness, no fatigue, no fever, no numbness, no seizures and no vomiting   Fall Associated symptoms include arthralgias, headaches, myalgias, nausea and neck pain. Pertinent negatives include no abdominal pain, chest pain, chills, fatigue, fever, joint swelling, numbness, vomiting or weakness.    History reviewed. No pertinent past medical history. Past Surgical History  Procedure Laterality Date  . Cholecystectomy    . Uterine fibroid  embolization    . Abdominal hysterectomy    . Hernia repair      2012 and July 2015   History reviewed. No pertinent family history. History  Substance Use Topics  . Smoking status: Never Smoker   . Smokeless tobacco: Not on file  . Alcohol Use: No   OB History   Grav Para Term Preterm Abortions TAB SAB Ect Mult Living                 Review of Systems  Constitutional: Negative for fever, chills and fatigue.  Respiratory: Negative for chest tightness and shortness of breath.   Cardiovascular: Negative for chest pain and palpitations.  Gastrointestinal: Positive for nausea. Negative for vomiting, abdominal pain, diarrhea, constipation and blood in stool.  Musculoskeletal: Positive for arthralgias, myalgias, neck pain and neck stiffness. Negative for back pain, gait problem and joint swelling.  Neurological: Positive for headaches. Negative for dizziness, tremors, seizures, syncope, weakness, light-headedness and numbness.  All other systems reviewed and are negative.     Allergies  Review of patient's allergies indicates no known allergies.  Home Medications   Prior to Admission medications   Medication Sig Start Date End Date Taking? Authorizing Provider  diazepam (VALIUM) 5 MG tablet Take 5 mg by mouth every 8 (eight) hours as needed for anxiety.   Yes Historical Provider, MD  HYDROcodone-acetaminophen (NORCO/VICODIN) 5-325 MG per tablet Take 1-2 tablets by mouth every 6 (six) hours as needed for moderate pain.   Yes Historical Provider, MD  BP 116/75  Pulse 66  Temp(Src) 99 F (37.2 C) (Oral)  Resp 16  SpO2 100%  LMP 01/10/2012 Physical Exam  Nursing note and vitals reviewed. Constitutional: She is oriented to person, place, and time. She appears well-developed and well-nourished. No distress.  HENT:  Head: Normocephalic and atraumatic.  Mouth/Throat: Oropharynx is clear and moist. No oropharyngeal exudate.  Eyes: Conjunctivae and EOM are normal. Pupils are  equal, round, and reactive to light. No scleral icterus.  Neck: No JVD present. No thyromegaly present.  Cardiovascular: Normal rate, regular rhythm, normal heart sounds and intact distal pulses.  Exam reveals no gallop and no friction rub.   No murmur heard. Pulmonary/Chest: Effort normal and breath sounds normal. No respiratory distress. She has no wheezes. She has no rales. She exhibits no tenderness.  Abdominal: Soft. Bowel sounds are normal. She exhibits no distension and no mass. There is no tenderness. There is no rebound and no guarding.  6 well healing surgical incisions  Musculoskeletal:       Right shoulder: She exhibits decreased range of motion, tenderness, bony tenderness, pain and spasm. She exhibits no swelling, no effusion, no crepitus, no deformity, no laceration, normal pulse and normal strength.       Cervical back: She exhibits decreased range of motion, tenderness, pain and spasm. She exhibits no bony tenderness, no swelling, no edema, no deformity, no laceration and normal pulse.       Thoracic back: Normal.       Lumbar back: Normal.       Right hand: Normal sensation noted. Decreased sensation is not present in the ulnar distribution, is not present in the medial distribution and is not present in the radial distribution. Normal strength noted. She exhibits no finger abduction, no thumb/finger opposition and no wrist extension trouble.  5/5 internal rotation, 5/5 external rotation, negative empty can.    Lymphadenopathy:    She has no cervical adenopathy.  Neurological: She is alert and oriented to person, place, and time. She has normal strength. No cranial nerve deficit or sensory deficit.  Skin: Skin is warm and dry. She is not diaphoretic.  Psychiatric: She has a normal mood and affect. Her behavior is normal. Judgment and thought content normal.    ED Course  Procedures (including critical care time) Labs Review Labs Reviewed - No data to display  Imaging  Review Dg Shoulder Right  12/16/2013   CLINICAL DATA:  Status post fall 6 days ago with persistent right shoulder pain  EXAM: RIGHT SHOULDER - 2+ VIEW  COMPARISON:  None.  FINDINGS: The bones of the right shoulder are adequately mineralized. There is no acute fracture nor dislocation. The observed portions of the right clavicle and upper right ribs are normal. The overlying soft tissues are unremarkable.  IMPRESSION: There is no acute bony abnormality of the right shoulder.   Electronically Signed   By: David  Martinique   On: 12/16/2013 13:29   Ct Head Wo Contrast  12/16/2013   CLINICAL DATA:  Patient fell 6 days ago, headache  EXAM: CT HEAD WITHOUT CONTRAST  TECHNIQUE: Contiguous axial images were obtained from the base of the skull through the vertex without intravenous contrast.  COMPARISON:  None.  FINDINGS: There is no evidence of mass effect, midline shift or extra-axial fluid collections. There is no evidence of a space-occupying lesion or intracranial hemorrhage. There is no evidence of a cortical-based area of acute infarction.  The ventricles and sulci are appropriate for the patient's  age. The basal cisterns are patent.  Visualized portions of the orbits are unremarkable. The visualized portions of the paranasal sinuses and mastoid air cells are unremarkable.  The osseous structures are unremarkable.  IMPRESSION: Normal CT of the brain without intravenous contrast.   Electronically Signed   By: Kathreen Devoid   On: 12/16/2013 13:38     EKG Interpretation None      MDM   Final diagnoses:  Right shoulder pain  Muscle spasms of neck  Acute nonintractable headache, unspecified headache type   Patient is a 33 y.o. Female who presents to the ED with neck pain, headache, and right shoulder pain.  History is not concerning for cauda equina at this time.  There are no focal neuro deficits on exam. Patient has palpable spasm of the trapezius and right cervical paraspinal muscles.  Rotator cuff  appears to be intact at this time.  Plain film xray of the right shoulder reveals no acute fractures at this time.  CT of the head secondary to trauma shows no acute abnormalities at this time.  Patient was treated with IM injections of toradol, decadron, and given PO benadryl, valium, and reglan here with good relief of symptoms.  Patient is stable for discharge at this time.  Believe that pain is likely due to muscle spasm.  Patient will be discharged home with percocet 5/325.  Patient already has valium at home at this time.  Patient was told to return to the ED for cauda equina symptoms and uncontrollable pain with oral medications at home.  Patient was told to follow-up with her PCP this week.  She states understanding at this time.      Cherylann Parr, PA-C 12/16/13 1623

## 2013-12-16 NOTE — Discharge Instructions (Signed)
Muscle Cramps and Spasms °Muscle cramps and spasms occur when a muscle or muscles tighten and you have no control over this tightening (involuntary muscle contraction). They are a common problem and can develop in any muscle. The most common place is in the calf muscles of the leg. Both muscle cramps and muscle spasms are involuntary muscle contractions, but they also have differences:  °· Muscle cramps are sporadic and painful. They may last a few seconds to a quarter of an hour. Muscle cramps are often more forceful and last longer than muscle spasms. °· Muscle spasms may or may not be painful. They may also last just a few seconds or much longer. °CAUSES  °It is uncommon for cramps or spasms to be due to a serious underlying problem. In many cases, the cause of cramps or spasms is unknown. Some common causes are:  °· Overexertion.   °· Overuse from repetitive motions (doing the same thing over and over).   °· Remaining in a certain position for a long period of time.   °· Improper preparation, form, or technique while performing a sport or activity.   °· Dehydration.   °· Injury.   °· Side effects of some medicines.   °· Abnormally low levels of the salts and ions in your blood (electrolytes), especially potassium and calcium. This could happen if you are taking water pills (diuretics) or you are pregnant.   °Some underlying medical problems can make it more likely to develop cramps or spasms. These include, but are not limited to:  °· Diabetes.   °· Parkinson disease.   °· Hormone disorders, such as thyroid problems.   °· Alcohol abuse.   °· Diseases specific to muscles, joints, and bones.   °· Blood vessel disease where not enough blood is getting to the muscles.   °HOME CARE INSTRUCTIONS  °· Stay well hydrated. Drink enough water and fluids to keep your urine clear or pale yellow. °· It may be helpful to massage, stretch, and relax the affected muscle. °· For tight or tense muscles, use a warm towel, heating  pad, or hot shower water directed to the affected area. °· If you are sore or have pain after a cramp or spasm, applying ice to the affected area may relieve discomfort. °¨ Put ice in a plastic bag. °¨ Place a towel between your skin and the bag. °¨ Leave the ice on for 15-20 minutes, 03-04 times a day. °· Medicines used to treat a known cause of cramps or spasms may help reduce their frequency or severity. Only take over-the-counter or prescription medicines as directed by your caregiver. °SEEK MEDICAL CARE IF:  °Your cramps or spasms get more severe, more frequent, or do not improve over time.  °MAKE SURE YOU:  °· Understand these instructions. °· Will watch your condition. °· Will get help right away if you are not doing well or get worse. °Document Released: 10/29/2001 Document Revised: 09/03/2012 Document Reviewed: 04/25/2012 °ExitCare® Patient Information ©2015 [employer]. This information is not intended to replace advice given to you by your health care provider. Make sure you discuss any questions you have with your health care provider. ° ° °Emergency Department Resource Guide °1) Find a Doctor and Pay Out of Pocket °Although you won't have to find out who is covered by your insurance plan, it is a good idea to ask around and get recommendations. You will then need to call the office and see if the doctor you have chosen will accept you as a new patient and what types of options they offer   for patients who are self-pay. Some doctors offer discounts or will set up payment plans for their patients who do not have insurance, but you will need to ask so you aren't surprised when you get to your appointment. ° °2) Contact Your Local Health Department °Not all health departments have doctors that can see patients for sick visits, but many do, so it is worth a call to see if yours does. If you don't know where your local health department is, you can check in your phone book. The CDC also has a tool to help  you locate your state's health department, and many state websites also have listings of all of their local health departments. ° °3) Find a Walk-in Clinic °If your illness is not likely to be very severe or complicated, you may want to try a walk in clinic. These are popping up all over the country in pharmacies, drugstores, and shopping centers. They're usually staffed by nurse practitioners or physician assistants that have been trained to treat common illnesses and complaints. They're usually fairly quick and inexpensive. However, if you have serious medical issues or chronic medical problems, these are probably not your best option. ° °No Primary Care Doctor: °- Call [facility] at  832-8000 - they can help you locate a primary care doctor that  accepts your insurance, provides certain services, etc. °- Physician Referral Service- 1-800-533-3463 ° °Chronic Pain Problems: °Organization         Address  Phone   Notes  °[facility]  (336) 297-2271 Patients need to be referred by their primary care doctor.  ° °Medication Assistance: °Organization         Address  Phone   Notes  °[place] [facility] [address] °[place], [place] [address] (336) 641-8030 --Must be a resident of [place] °-- Must have NO insurance coverage whatsoever (no Medicaid/ Medicare, etc.) °-- The pt. MUST have a primary care doctor that directs their care regularly and follows them in the community °  °[facility]  (866) 331-1348   °[employer]  (888) 892-1162   ° °Agencies that provide inexpensive medical care: °Organization         Address  Phone   Notes  °[facility]  (336) 832-8035   °[facility]    (336) 832-7272   °[facility] [address] °[place], [place] [address] (336) 832-4777   °[facility] [address], °[place] (336) 271-4999   °[facility]    (336) 373-0678   °[facility]    (336) 272-1050   °[facility] ° [address], [place] Phone:  (336) 832-4444, Fax:  (336) 832-4440 Hours of Operation:  9 am - 6 pm, M-F.  Also accepts Medicaid/Medicare and self-pay.  °[facility] ° [address], [place] Phone: (336) 832-3150, Fax: (336) [phone]. Hours of Operation:  8:30 am - 5:30 pm, M-F.  Also accepts Medicaid and self-pay.  °[facility] [place] [address], [place] Phone: (336) 878-6027   °[facility] [address], [place], [place] (336)723-1848, Ext. 123 Mondays & Thursdays: 7-9 AM.  First 15 patients are seen on a first come, first serve basis. °  ° °Medicaid-accepting [place] Providers: ° °Organization         Address  Phone   Notes  °[facility]   [address], [place] (336) 641-2100 Also accepts self-pay patients.  °[facility] [address], [place] ° (336) 856-9996   °[facility] [address], [place] (336) 288-8857   °[facility] [address] [place] [address], [place] (336) 299-7000   °Veita Bland [address], [place]  ° (336) 373-1557 Only accepts [place] [employer] Medicaid patients after they have their name applied to their card.  ° °Self-Pay (no insurance) in [place]: ° °Organization         Address  Phone   Notes  °Sickle Cell Patients, [facility] [address], [place] (336) 832-1970   °[facility] [address], [place] (336) 832-4400   °[facility] ° [address], [place] (336) 992-4800   °[facility]/Dr. Osei-Bonsu ° [address] [place] [address], [place] or [address], [place] (336) 841-8500 Phone number for both [place] and [place] locations is the same.  °[facility] [address],  [place] (336) 299-0000   °[facility] [address] [place] [address], [place] or [address] (336) 852-7530 °(336) 878-2260   °[facility] [address], [place] (336) 350-1642, phone; (336) 294-5005, fax Sees patients 1st and 3rd Saturday of every month.  Must not qualify for public or private insurance (i.e. Medicaid, Medicare, [place] Health Choice, Veterans' Benefits) • Household income should be no more than 200% of the poverty level •The clinic cannot treat you if you are pregnant or think you are pregnant • Sexually transmitted diseases are not treated at the clinic.  ° ° °Dental Care: °Organization         Address  Phone  Notes  °[facility] [address], [place] (336) 641-6152 Accepts children up to age 21 who are enrolled in Medicaid or [place] Health Choice; pregnant women with a Medicaid card; and children who have applied for Medicaid or [place] Health Choice, but were declined, whose parents can pay a reduced fee at time of service.  °[facility] [place]  [address], [place] (336) 641-7733 Accepts children up to age 21 who are enrolled in Medicaid or [place] Health Choice; pregnant women with a Medicaid card; and children who have applied for Medicaid or [place] Health Choice, but were declined, whose parents can pay a reduced fee at time of service.  °[facility] ° [address], [place] (336) 641-4533 Patients are seen by appointment only. Walk-ins are not accepted. [facility] will see patients 18 years of age and older. °Monday - Tuesday (8am-5pm) °Most Wednesdays (8:30-5pm) °$30 per visit, cash only  °[facility] ° [address], [place] (336) 641-4533 Patients are seen by appointment only. Walk-ins are not accepted. [facility] will see patients 18 years of age and older. °One Wednesday Evening  (Monthly: Volunteer Based).  $30 per visit, cash only  °[facility]  (919) 537-3737 for adults; Children under age 4, call Graduate Pediatric Dentistry at (919) [phone]. Children aged 4-14, please call (919) [phone] to request a pediatric application. ° Dental services are provided in all areas of dental care including fillings, crowns and bridges, complete and partial dentures, implants, gum   treatment, root canals, and extractions. Preventive care is also provided. Treatment is provided to both adults and children. °Patients are selected via a lottery and there is often a waiting list. °  °[facility] [address], °[place] ° (336) 763-8833 [email or web address] °  °[facility] [address], [place], [place] (336)723-1848, Ext. 123 Second and Fourth Thursday of each month, opens at 6:30 AM; Clinic ends at 9 AM.  Patients are seen on a first-come first-served basis, and a limited number are seen during each clinic.  ° °[facility] ° [address], [place], [place] (336) 723-7904   Eligibility Requirements °You must have lived in [place], [place], or [place] counties for at least the last three months. °  You cannot be eligible for state or federal sponsored healthcare insurance, including Veterans Administration, Medicaid, or Medicare. °  You generally cannot be eligible for healthcare insurance through your employer.  °  How to apply: °Eligibility screenings are held every Tuesday and Wednesday afternoon from 1:00 pm until 4:00 pm. You do not need an appointment for the interview!  °[facility] [address], [place], [place] 336-631-2330   °[facility]  336-342-8273   °[facility]  336-703-3100   °[facility]  336-570-6415   ° °Behavioral Health Resources in the Community: °Intensive Outpatient Programs °Organization         Address  Phone  Notes  °[place]  [facility] [address], [place], [place] 336-878-6098   °[facility] [address], [place], [place] 336-832-9800   °[facility] [address], [place], [place] ° 336-882-2125   °[facility] [address],  °[place], [place] 1-800-853-5163 or 336-641-4981   °Substance Abuse Resources °Organization         Address  Phone  Notes  °[facility]  336-882-2125   °[facility]  336-784-9470   °[facility]  336-285-9073   °[facility]  336-845-3988   °Residential & Outpatient Substance Abuse Program  1-800-659-3381   °Psychological Services °Organization         Address  Phone  Notes  °[facility]  336- 832-9600   °[facility]  336- 378-7881   °[facility] [address], [place] 1-800-853-5163 or 336-641-4981   ° °Mobile Crisis Teams °Organization         Address  Phone  Notes  °[facility], Mobile Crisis Care Unit  1-877-626-1772   °[facility] °[facility] ° [address] [place], [place] 336-834-9664   °Sharon DeEsch [address] °[place] 336-554-5454   ° °Self-Help/Support Groups °Organization         Address  Phone             Notes  °[facility] - variety of support groups  336- 373-1402 Call for more information  °Narcotics Anonymous (NA), [facility] [address], °[place]  2 meetings at this location  ° °Residential Treatment Programs °Organization         Address  Phone  Notes  °[facility] [address],    °[place]  1-866-801-8205   °[facility] ° [address], [place],  704-293-8524   °[facility] [address], [place] 336-845-3988 Admissions: 8am-3pm M-F  °[facility] [address]    °[place], [place] 336-841-1104   °[facility] [address],  [place], [place] 336-379-7146   °[facility] [address]  °[place], [place] 336-285-9073   °[facility] - Intensive Outpatient [address], [place], [place] 336-852-3033   °[facility] [address]  °[place], [place] 1-877-615-2722 or 336-784-9470   °[facility] [address] [place], [place] 336-227-7417 Accepts Medicaid  °[facility] [address]  °[place] 1-800-659-3381 Substance Abuse/Addiction Treatment  ° °[place] Behavioral Health Resources °Organization         Address  Phone  Notes  °[facility]  (888) 581-9988   °Julie Brannon, [name] [address] [place], [place]   (336) 349-5553 or (336) 951-0000   °[facility]   [address] °[place], [place] (336) 349-4454   °[facility] [address], [place], [place] (336) 342-8316 Insurance/Medicaid/sponsorship through [facility]  °[facility] [address]                                    [place], [place] (336) 342-8316 Therapy/tele-psych/case  °[facility] [address]  ° [place], [place] (336) 349-2233    °Dr. Arfeen  (336) 349-4544   °[facility] [place]  [employer] [place] [facility] 1) [address], [place] °2) [address], [place] °3)  [address], [place] (336) 349-3220 °(336) 342-7768 ° °(336) 342-8140   °[place] Child [facility] (336) 342-1394 or (336) 342-3537 (After Hours)    ° ° ° °

## 2013-12-16 NOTE — ED Notes (Addendum)
Pt reports she had hernia surgery on 7/20, incisions healing well. Pt reports she was not suppose to be getting out of bed but no one was home, so pt got up and ended up falling head first down 8 stairs on Wednesday 7/22. Pt reports she was taking pain pills for surgery so didn't notice the pain as much at first. Pain has progressively gotten worse. Pt reports she kind of hit her head when she fell but no LOC. Reports headache, right neck pain, right sided pain. 10/10. Pt has unable to sleep last 2 days. Pt reports she ran out of pain medications.

## 2013-12-16 NOTE — ED Provider Notes (Signed)
Medical screening examination/treatment/procedure(s) were performed by non-physician practitioner and as supervising physician I was immediately available for consultation/collaboration.   EKG Interpretation None        Hoy Morn, MD 12/16/13 1630

## 2013-12-16 NOTE — ED Notes (Signed)
2 RN's attempted PIV insertion, no success, IV team paged.

## 2013-12-16 NOTE — Progress Notes (Addendum)
  CARE MANAGEMENT ED NOTE 12/16/2013  Patient:  Mackenzie [name], Mackenzie [name]   Account Number:  [id number]  Date Initiated:  12/16/2013  Documentation initiated by:  Jackelyn Poling  Subjective/Objective Assessment:   33 yr old medicaid [place] access [place] pt hernia surgery on 7/20, incisions healing well. Pt reports she was not suppose to be getting out of bed but no one was home, so pt got up and ended up falling head first down 8 stairs on     Subjective/Objective Assessment Detail:   7/22. Pt reports she was taking pain pills for surgery so didn't notice the pain as much at first. Pain has progressively gotten worse. Pt reports she kind of hit her head when she fell but no LOC. Reports headache, right neck pain, right sided pain. 10/10. Pt has unable to sleep last 2 days. Pt reports she ran out of pain medications  Confirms no pcp     Action/Plan:   CM noted no pcp listed for this [place] access pt CM provided pt with a list of medicaid providers for [employer]   Action/Plan Detail:   Anticipated DC Date:  12/16/2013     Status Recommendation to Physician:   Result of Recommendation:    Other ED [facility]  Other  PCP issues  Outpatient Services - Pt will follow up    Choice offered to / List presented to:            Status of service:  Completed, signed off  ED Comments:   ED Comments Detail:   Confirms she has medicaid services because of her children and only have a OB GYN

## 2014-05-01 ENCOUNTER — Encounter (HOSPITAL_COMMUNITY): Payer: Self-pay | Admitting: Emergency Medicine

## 2014-05-01 ENCOUNTER — Emergency Department (HOSPITAL_COMMUNITY)
Admission: EM | Admit: 2014-05-01 | Discharge: 2014-05-01 | Disposition: A | Discharge status: Home / Self Care | Payer: Medicaid Other | Attending: Emergency Medicine | Admitting: Emergency Medicine

## 2014-05-01 DIAGNOSIS — N760 Acute vaginitis: Secondary | ICD-10-CM | POA: Insufficient documentation

## 2014-05-01 DIAGNOSIS — B9689 Other specified bacterial agents as the cause of diseases classified elsewhere: Secondary | ICD-10-CM

## 2014-05-01 DIAGNOSIS — G43909 Migraine, unspecified, not intractable, without status migrainosus: Secondary | ICD-10-CM

## 2014-05-01 HISTORY — DX: Migraine, unspecified, not intractable, without status migrainosus: G43.909

## 2014-05-01 LAB — COMPREHENSIVE METABOLIC PANEL
ALBUMIN: 3.5 g/dL (ref 3.5–5.2)
ALT: 6 U/L (ref 0–35)
ANION GAP: 11 (ref 5–15)
AST: 12 U/L (ref 0–37)
Alkaline Phosphatase: 57 U/L (ref 39–117)
BUN: 10 mg/dL (ref 6–23)
CHLORIDE: 105 meq/L (ref 96–112)
CO2: 25 mEq/L (ref 19–32)
CREATININE: 0.63 mg/dL (ref 0.50–1.10)
Calcium: 8.9 mg/dL (ref 8.4–10.5)
GFR calc Af Amer: >90 mL/min (ref >90)
Glucose, Bld: 93 mg/dL (ref 70–99)
Potassium: 3.9 mEq/L (ref 3.7–5.3)
Sodium: 141 mEq/L (ref 137–147)
Total Protein: 7.1 g/dL (ref 6.0–8.3)

## 2014-05-01 LAB — CBC WITH DIFFERENTIAL/PLATELET
BASOS PCT: 0 % (ref 0–1)
Basophils Absolute: 0 10*3/uL (ref 0.0–0.1)
EOS ABS: 0.1 10*3/uL (ref 0.0–0.7)
Eosinophils Relative: 2 % (ref 0–5)
HEMATOCRIT: 38.3 % (ref 36.0–46.0)
HEMOGLOBIN: 12.2 g/dL (ref 12.0–15.0)
Lymphocytes Relative: 36 % (ref 12–46)
Lymphs Abs: 2.5 10*3/uL (ref 0.7–4.0)
MCH: 28.8 pg (ref 26.0–34.0)
MCHC: 31.9 g/dL (ref 30.0–36.0)
MCV: 90.5 fL (ref 78.0–100.0)
MONO ABS: 0.4 10*3/uL (ref 0.1–1.0)
MONOS PCT: 6 % (ref 3–12)
NEUTROS ABS: 3.9 10*3/uL (ref 1.7–7.7)
Neutrophils Relative %: 56 % (ref 43–77)
Platelets: 263 10*3/uL (ref 150–400)
RBC: 4.23 MIL/uL (ref 3.87–5.11)
RDW: 13.4 % (ref 11.5–15.5)
WBC: 6.8 10*3/uL (ref 4.0–10.5)

## 2014-05-01 LAB — URINALYSIS, ROUTINE W REFLEX MICROSCOPIC
BILIRUBIN URINE: NEGATIVE
Glucose, UA: NEGATIVE mg/dL
Hgb urine dipstick: NEGATIVE
KETONES UR: NEGATIVE mg/dL
Leukocytes, UA: NEGATIVE
NITRITE: NEGATIVE
PROTEIN: NEGATIVE mg/dL
SPECIFIC GRAVITY, URINE: 1.026 (ref 1.005–1.030)
UROBILINOGEN UA: 1 mg/dL (ref 0.0–1.0)
pH: 8 (ref 5.0–8.0)

## 2014-05-01 LAB — LIPASE, BLOOD: LIPASE: 47 U/L (ref 11–59)

## 2014-05-01 LAB — WET PREP, GENITAL
Trich, Wet Prep: NONE SEEN
Yeast Wet Prep HPF POC: NONE SEEN

## 2014-05-01 MED ORDER — METRONIDAZOLE 500 MG PO TABS: 500.0000 mg | ORAL_TABLET | Freq: Two times a day (BID) | ORAL | Status: DC

## 2014-05-01 MED ORDER — BUTALBITAL-APAP-CAFFEINE 50-325-40 MG PO TABS: 1.0000 | ORAL_TABLET | Freq: Four times a day (QID) | ORAL | Status: DC | PRN

## 2014-05-01 MED ORDER — METRONIDAZOLE 500 MG PO TABS
500.0000 mg | ORAL_TABLET | Freq: Once | ORAL | Status: AC
  Administered 2014-05-01: 500 mg via ORAL
  Filled 2014-05-01: qty 1

## 2014-05-01 MED ORDER — KETOROLAC TROMETHAMINE 60 MG/2ML IM SOLN
60.0000 mg | Freq: Once | INTRAMUSCULAR | Status: AC
  Administered 2014-05-01: 60 mg via INTRAMUSCULAR
  Filled 2014-05-01: qty 2

## 2014-05-01 NOTE — Discharge Instructions (Signed)
Bacterial Vaginosis Bacterial vaginosis is a vaginal infection that occurs when the normal balance of bacteria in the vagina is disrupted. It results from an overgrowth of certain bacteria. This is the most common vaginal infection in women of childbearing age. Treatment is important to prevent complications, especially in pregnant women, as it can cause a premature delivery. CAUSES  Bacterial vaginosis is caused by an increase in harmful bacteria that are normally present in smaller amounts in the vagina. Several different kinds of bacteria can cause bacterial vaginosis. However, the reason that the condition develops is not fully understood. RISK FACTORS Certain activities or behaviors can put you at an increased risk of developing bacterial vaginosis, including:  Having a new sex partner or multiple sex partners.  Douching.  Using an intrauterine device (IUD) for contraception. Women do not get bacterial vaginosis from toilet seats, bedding, swimming pools, or contact with objects around them. SIGNS AND SYMPTOMS  Some women with bacterial vaginosis have no signs or symptoms. Common symptoms include:  Grey vaginal discharge.  A fishlike odor with discharge, especially after sexual intercourse.  Itching or burning of the vagina and vulva.  Burning or pain with urination. DIAGNOSIS  Your health care provider will take a medical history and examine the vagina for signs of bacterial vaginosis. A sample of vaginal fluid may be taken. Your health care provider will look at this sample under a microscope to check for bacteria and abnormal cells. A vaginal pH test may also be done.  TREATMENT  Bacterial vaginosis may be treated with antibiotic medicines. These may be given in the form of a pill or a vaginal cream. A second round of antibiotics may be prescribed if the condition comes back after treatment.  HOME CARE INSTRUCTIONS   Only take over-the-counter or prescription medicines as  directed by your health care provider.  If antibiotic medicine was prescribed, take it as directed. Make sure you finish it even if you start to feel better.  Do not have sex until treatment is completed.  Tell all sexual partners that you have a vaginal infection. They should see their health care provider and be treated if they have problems, such as a mild rash or itching.  Practice safe sex by using condoms and only having one sex partner. SEEK MEDICAL CARE IF:   Your symptoms are not improving after 3 days of treatment.  You have increased discharge or pain.  You have a fever. MAKE SURE YOU:   Understand these instructions.  Will watch your condition.  Will get help right away if you are not doing well or get worse. FOR MORE INFORMATION  Centers for Disease Control and Prevention, Division of STD Prevention: AppraiserFraud.fi American Sexual Health Association (ASHA): www.ashastd.org  Document Released: 05/09/2005 Document Revised: 02/27/2013 Document Reviewed: 12/19/2012 [facility] Patient Information 2015 [place], [place]. This information is not intended to replace advice given to you by your health care provider. Make sure you discuss any questions you have with your health care provider.  Migraine Headache A migraine headache is very bad, throbbing pain on one or both sides of your head. Talk to your doctor about what things may bring on (trigger) your migraine headaches. HOME CARE  Only take medicines as told by your doctor.  Lie down in a dark, quiet room when you have a migraine.  Keep a journal to find out if certain things bring on migraine headaches. For example, write down:  What you eat and drink.  How much sleep  you get.  Any change to your diet or medicines.  Lessen how much alcohol you drink.  Quit smoking if you smoke.  Get enough sleep.  Lessen any stress in your life.  Keep lights dim if bright lights bother you or make your migraines  worse. GET HELP RIGHT AWAY IF:   Your migraine becomes really bad.  You have a fever.  You have a stiff neck.  You have trouble seeing.  Your muscles are weak, or you lose muscle control.  You lose your balance or have trouble walking.  You feel like you will pass out (faint), or you pass out.  You have really bad symptoms that are different than your first symptoms. MAKE SURE YOU:   Understand these instructions.  Will watch your condition.  Will get help right away if you are not doing well or get worse. Document Released: 02/16/2008 Document Revised: 08/01/2011 Document Reviewed: 01/14/2013 [facility] Patient Information 2015 [place], [place]. This information is not intended to replace advice given to you by your health care provider. Make sure you discuss any questions you have with your health care provider.

## 2014-05-01 NOTE — ED Notes (Signed)
Pt w/ hx of migraines c/o migraine w/ photophobia and phonophobia x 6 days.  Also c/o low abd pain w/o NVD, vaginal issues, or dysuria x 2 days.

## 2014-05-01 NOTE — ED Provider Notes (Signed)
CSN: [id number]     Arrival date & time 05/01/14  1720 History   First MD Initiated Contact with Patient 05/01/14 1901     Chief Complaint  Patient presents with  . Migraine  . Abdominal Pain     (Consider location/radiation/quality/duration/timing/severity/associated sxs/prior Treatment) HPI   33 year old female with history of migraine and past surgical history including cholecystectomy, uterine fibroid embolization, abdominal hysterectomy and hernia repair who presents for evaluation of headache and abdominal pain. Patient states she has history of migraine headache in which she would have headache on a weekly basis. For the past 6 days she has had persistent gradual onset of sharp throbbing headache across the temple. Headache is unrelenting, not improved with her over-the-counter medication. She also endorsed light sensitivity and sound sensitivity, along with nausea without vomiting. No associated neck stiffness, fever or chills or rash. For the past 2 days she also complaining of low abdominal pain. She described pain as an achy sensation, waxing and waning, without any other associated factors. Patient states she is not sexually active, but does endorse some mild vaginal discharge without odor. She denies any dysuria, hematuria, or vaginal bleeding. She has had a total hysterectomy. She denies any prior history of STD.   Past Medical History  Diagnosis Date  . Migraines    Past Surgical History  Procedure Laterality Date  . Cholecystectomy    . Uterine fibroid embolization    . Abdominal hysterectomy    . Hernia repair      2012 and July 2015   No family history on file. History  Substance Use Topics  . Smoking status: Never Smoker   . Smokeless tobacco: Not on file  . Alcohol Use: No   OB History    No data available     Review of Systems  All other systems reviewed and are negative.     Allergies  Review of patient's allergies indicates no known  allergies.  Home Medications   Prior to Admission medications   Medication Sig Start Date End Date Taking? Authorizing Provider  diazepam (VALIUM) 5 MG tablet Take 5 mg by mouth every 8 (eight) hours as needed for anxiety.    Historical Provider, MD  HYDROcodone-acetaminophen (NORCO/VICODIN) 5-325 MG per tablet Take 1-2 tablets by mouth every 6 (six) hours as needed for moderate pain.    Historical Provider, MD  oxyCODONE-acetaminophen (PERCOCET/ROXICET) 5-325 MG per tablet Take 1-2 tablets by mouth every 6 (six) hours as needed for moderate pain or severe pain. 12/16/13   Courtney A Forcucci, PA-C   BP 113/71 mmHg  Pulse 77  Temp(Src) 98.3 F (36.8 C)  Resp 18  SpO2 100%  LMP 01/10/2012 Physical Exam  Constitutional: She appears well-developed and well-nourished. No distress.  HENT:  Head: Atraumatic.  Right Ear: External ear normal.  Left Ear: External ear normal.  Nose: Nose normal.  Mouth/Throat: Oropharynx is clear and moist. No oropharyngeal exudate.  Eyes: Conjunctivae and EOM are normal. Pupils are equal, round, and reactive to light.  Neck: Normal range of motion. Neck supple.  No nuchal rigidity  Cardiovascular: Normal rate and regular rhythm.   Pulmonary/Chest: Effort normal. No respiratory distress. She exhibits no tenderness.  Abdominal: Soft. Bowel sounds are normal. She exhibits no distension. There is tenderness (Suprapubic tenderness without guarding or rebound tenderness. No peritoneal sign.). Hernia confirmed negative in the right inguinal area and confirmed negative in the left inguinal area.  No CVA tenderness.  Genitourinary: Vagina normal. There is no  tenderness or lesion on the right labia. There is no tenderness or lesion on the left labia. Cervix exhibits no motion tenderness. Right adnexum displays tenderness. Left adnexum displays no tenderness.  Chaperone present.  Lymphadenopathy:       Right: No inguinal adenopathy present.       Left: No inguinal  adenopathy present.  Neurological: She is alert.  Skin: No rash noted.  Psychiatric: She has a normal mood and affect.  Nursing note and vitals reviewed.   ED Course  Procedures (including critical care time)  Headache similar to previous, no fever, neck stiffness, neuro findings or new symptoms to suggest more serious etiology.  I don't think SAH, ICH, meningitis, encephalitis, mass at this time.  No recent trauma.  I don't feel imaging necessary at this time.  Plan to control symptoms.  7:58 PM On pelvic examination, mild R adnexal tenderness but no CMT.    9:26 PM Wet prep show evidence of bacterial vaginosis. Will treat with Flagyl. Patient made aware not to drink any alcohol while taking this medication. Patient reports some improvement with a headache after receiving Toradol. Plan to have patient follow-up with neurologist for further management of her recurrent headache. Patient will be discharge with headache medication. Return precautions discussed.  Labs Review Labs Reviewed  WET PREP, GENITAL - Abnormal; Notable for the following:    Clue Cells Wet Prep HPF POC MANY (*)    WBC, Wet Prep HPF POC RARE (*)    All other components within normal limits  COMPREHENSIVE METABOLIC PANEL - Abnormal; Notable for the following:    Total Bilirubin <0.2 (*)    All other components within normal limits  URINALYSIS, ROUTINE W REFLEX MICROSCOPIC - Abnormal; Notable for the following:    APPearance CLOUDY (*)    All other components within normal limits  GC/CHLAMYDIA PROBE AMP  CBC WITH DIFFERENTIAL  LIPASE, BLOOD  RPR  HIV ANTIBODY (ROUTINE TESTING)    Imaging Review No results found.   EKG Interpretation None      MDM   Final diagnoses:  Migraine without status migrainosus, not intractable, unspecified migraine type  BV (bacterial vaginosis)    BP 113/71 mmHg  Pulse 77  Temp(Src) 98.3 F (36.8 C)  Resp 18  SpO2 100%  LMP 01/10/2012     Domenic Moras,  PA-C 05/01/14 2127  Threasa Beards, MD 05/01/14 2132

## 2014-05-02 LAB — RPR

## 2014-05-02 LAB — GC/CHLAMYDIA PROBE AMP
CT PROBE, AMP APTIMA: NEGATIVE
GC Probe RNA: NEGATIVE

## 2014-05-02 LAB — HIV ANTIBODY (ROUTINE TESTING W REFLEX): HIV: NONREACTIVE

## 2014-05-08 ENCOUNTER — Ambulatory Visit: Payer: Medicaid Other | Admitting: Neurology

## 2014-05-09 ENCOUNTER — Encounter: Payer: Self-pay | Admitting: Diagnostic Neuroimaging

## 2014-05-09 ENCOUNTER — Ambulatory Visit: Payer: Medicaid Other | Admitting: Neurology

## 2014-05-09 ENCOUNTER — Ambulatory Visit (INDEPENDENT_AMBULATORY_CARE_PROVIDER_SITE_OTHER): Payer: Medicaid Other | Admitting: Diagnostic Neuroimaging

## 2014-05-09 VITALS — BP 96/60 | HR 62 | Ht 64.0 in | Wt 155.6 lb

## 2014-05-09 DIAGNOSIS — G43011 Migraine without aura, intractable, with status migrainosus: Secondary | ICD-10-CM

## 2014-05-09 DIAGNOSIS — H538 Other visual disturbances: Secondary | ICD-10-CM

## 2014-05-09 DIAGNOSIS — L570 Actinic keratosis: Secondary | ICD-10-CM

## 2014-05-09 NOTE — Patient Instructions (Signed)
I will check MRI brain.  I will refer you to ophthalmology for keratosis and blurred vision.

## 2014-05-09 NOTE — Progress Notes (Signed)
[facility]  PATIENT: Mackenzie [name] DOB: Oct 16, 1980  REFERRING CLINICIAN: ER referral HISTORY FROM: patient  REASON FOR VISIT: new consult    HISTORICAL  CHIEF COMPLAINT:  Chief Complaint  Patient presents with  . Migraine    New patient Rm     HISTORY OF PRESENT ILLNESS:   33 year old right-handed female here for evaluation of headaches. Headache started age 1 years old with mild intermittent headaches. Over time they gradually worsened. Headaches significant worsened past few months and has been constant for past 2 weeks. Now she describes bitemporal throbbing severe headaches with photophobia. No phonophobia and nausea or vomiting. Chewing and eating seems to aggravate her pain. Patient has lost 13 pounds over past few weeks due to inability to eat. Patient has tried Tylenol, Motrin, Excedrin, without relief. She was prescribed Fioricet without relief. Patient has got to the emergency room several times for headache evaluation. No specific triggering factors for her headaches. Headaches are leading to insomnia.  Patient also has diagnosis of keratosis, previously evaluated by ophthalmology in [place]. Patient moved to [facility] 1-1/2 years ago. She struggling to get established with PCP and ophthalmology here. Patient not able to work due to blurred vision.   REVIEW OF SYSTEMS: Full 14 system review of systems performed and notable only for weight loss fatigue blurred vision double vision loss of vision eye pain feeling hot headache insomnia sleepiness.  ALLERGIES: No Known Allergies  HOME MEDICATIONS: Outpatient Prescriptions Prior to Visit  Medication Sig Dispense Refill  . butalbital-acetaminophen-caffeine (FIORICET) 50-325-40 MG per tablet Take 1-2 tablets by mouth every 6 (six) hours as needed for headache. 20 tablet 0  . diazepam (VALIUM) 5 MG tablet Take 5 mg by mouth every 8 (eight) hours as needed for anxiety.    Marland Kitchen HYDROcodone-acetaminophen  (NORCO/VICODIN) 5-325 MG per tablet Take 1-2 tablets by mouth every 6 (six) hours as needed for moderate pain.    Marland Kitchen ibuprofen (ADVIL,MOTRIN) 200 MG tablet Take 200 mg by mouth every 6 (six) hours as needed for moderate pain.    . metroNIDAZOLE (FLAGYL) 500 MG tablet Take 1 tablet (500 mg total) by mouth 2 (two) times daily. (Patient not taking: Reported on 05/09/2014) 14 tablet 0  . naproxen sodium (ANAPROX) 220 MG tablet Take 220 mg by mouth 2 (two) times daily as needed (pain).     No facility-administered medications prior to visit.    PAST MEDICAL HISTORY: Past Medical History  Diagnosis Date  . Migraines     PAST SURGICAL HISTORY: Past Surgical History  Procedure Laterality Date  . Cholecystectomy    . Uterine fibroid embolization    . Abdominal hysterectomy    . Hernia repair      2012 and July 2015    FAMILY HISTORY: Family History  Problem Relation Age of Onset  . Diabetes Maternal Grandmother     SOCIAL HISTORY:  History   Social History  . Marital Status: Single    Spouse Name: N/A    Number of Children: 2  . Years of Education: AS   Occupational History  . Not on file.   Social History Main Topics  . Smoking status: Never Smoker   . Smokeless tobacco: Never Used  . Alcohol Use: No  . Drug Use: No  . Sexual Activity: Yes   Other Topics Concern  . Not on file   Social History Narrative   Patient is right handed.   Patient does not drink caffeine.  PHYSICAL EXAM  Filed Vitals:   05/09/14 1155  BP: 96/60  Pulse: 62  Height: 5\' 4"  (1.626 m)  Weight: 155 lb 9.6 oz (70.58 kg)    Body mass index is 26.7 kg/(m^2).   Visual Acuity Screening   Right eye Left eye Both eyes  Without correction: 20/200 20/200   With correction:       No flowsheet data found.  GENERAL EXAM: Patient is in no distress; well developed, nourished and groomed; neck is supple  CARDIOVASCULAR: Regular rate and rhythm, no murmurs, no carotid  bruits  NEUROLOGIC: MENTAL STATUS: awake, alert, oriented to person, place and time, recent and remote memory intact, normal attention and concentration, language fluent, comprehension intact, naming intact, fund of knowledge appropriate CRANIAL NERVE: no papilledema on fundoscopic exam, pupils equal and reactive to light, visual fields full to confrontation, extraocular muscles intact, no nystagmus, facial sensation and strength symmetric, hearing intact, palate elevates symmetrically, uvula midline, shoulder shrug symmetric, tongue midline. MOTOR: normal bulk and tone, full strength in the BUE, BLE SENSORY: normal and symmetric to light touch, pinprick, temperature, vibration and proprioception COORDINATION: finger-nose-finger, fine finger movements, heel-shin normal REFLEXES: deep tendon reflexes present and symmetric GAIT/STATION: narrow based gait; able to walk on toes, heels and tandem; romberg is negative    DIAGNOSTIC DATA (LABS, IMAGING, TESTING) - I reviewed patient records, labs, notes, testing and imaging myself where available.  Lab Results  Component Value Date   WBC 6.8 05/01/2014   HGB 12.2 05/01/2014   HCT 38.3 05/01/2014   MCV 90.5 05/01/2014   PLT 263 05/01/2014      Component Value Date/Time   NA 141 05/01/2014 1819   K 3.9 05/01/2014 1819   CL 105 05/01/2014 1819   CO2 25 05/01/2014 1819   GLUCOSE 93 05/01/2014 1819   BUN 10 05/01/2014 1819   CREATININE 0.63 05/01/2014 1819   CALCIUM 8.9 05/01/2014 1819   PROT 7.1 05/01/2014 1819   ALBUMIN 3.5 05/01/2014 1819   AST 12 05/01/2014 1819   ALT 6 05/01/2014 1819   ALKPHOS 57 05/01/2014 1819   BILITOT <0.2* 05/01/2014 1819   GFRNONAA >90 05/01/2014 1819   GFRAA >90 05/01/2014 1819   No results found for: CHOL, HDL, LDLCALC, LDLDIRECT, TRIG, CHOLHDL No results found for: HGBA1C No results found for: VITAMINB12 No results found for: TSH   I reviewed images myself and agree with interpretation.  -VRP  12/16/13 CT head - normal     ASSESSMENT AND PLAN  33 y.o. year old female here with headaches since age 29 years old, worsening over past few months especially last 2 weeks. May represent transformed migraine. We'll check MRI of the brain to rule out secondary causes. Also patient has worsening vision problems, possibly related to underlying keratosis. We'll try to help her establish with ophthalmologist locally.  PLAN: - MRI brain - Amitriptyline 25-50 mg at bedtime - Continue NSAIDs when necessary headache - May consider triptan medication after MRI  Orders Placed This Encounter  Procedures  . MR Brain W Wo Contrast  . Ambulatory referral to Ophthalmology   Return in about 6 weeks (around 06/20/2014).    Penni Bombard, MD 42/35/3614, 43:15 PM Certified in Neurology, Neurophysiology and Neuroimaging  [facility] [address] [place], [place] [address] [phone]

## 2014-05-12 ENCOUNTER — Telehealth: Payer: Self-pay | Admitting: Diagnostic Neuroimaging

## 2014-05-12 MED ORDER — PREDNISONE 10 MG PO TABS: ORAL_TABLET | ORAL | Status: DC

## 2014-05-12 MED ORDER — AMITRIPTYLINE HCL 25 MG PO TABS: 25.0000 mg | ORAL_TABLET | Freq: Every evening | ORAL | Status: DC

## 2014-05-12 NOTE — Telephone Encounter (Signed)
Pt is calling stating that Dr. Leta Baptist was suppose to call in a Rx for prednisone and a headache medicine to [facility] on [address].  She called and it's not there.  Please call and advise

## 2014-05-12 NOTE — Telephone Encounter (Signed)
I called the patient back.  Says she was expecting a Rx for some type of steroid to help with her headaches along with preventive med.  I did not see a steroid mentioned in notes.  Please advise.  Thank you.

## 2014-05-12 NOTE — Telephone Encounter (Signed)
Spoke with patient. She will try amitriptyline first. Then try prednisone pack if needed. -VRP

## 2014-05-23 DIAGNOSIS — H18603 Keratoconus, unspecified, bilateral: Secondary | ICD-10-CM

## 2014-05-23 HISTORY — DX: Keratoconus, unspecified, bilateral: H18.603

## 2014-05-24 ENCOUNTER — Other Ambulatory Visit: Payer: Self-pay | Admitting: Diagnostic Neuroimaging

## 2014-05-24 ENCOUNTER — Encounter (INDEPENDENT_AMBULATORY_CARE_PROVIDER_SITE_OTHER): Payer: Medicaid Other | Admitting: Diagnostic Neuroimaging

## 2014-05-24 ENCOUNTER — Ambulatory Visit
Admission: RE | Admit: 2014-05-24 | Discharge: 2014-05-24 | Disposition: A | Discharge status: Home / Self Care | Payer: Medicaid Other | Source: Ambulatory Visit | Attending: Diagnostic Neuroimaging | Admitting: Diagnostic Neuroimaging

## 2014-05-24 DIAGNOSIS — L570 Actinic keratosis: Secondary | ICD-10-CM

## 2014-05-24 DIAGNOSIS — G43011 Migraine without aura, intractable, with status migrainosus: Secondary | ICD-10-CM

## 2014-05-24 DIAGNOSIS — H538 Other visual disturbances: Secondary | ICD-10-CM

## 2014-07-02 ENCOUNTER — Encounter: Payer: Self-pay | Admitting: Diagnostic Neuroimaging

## 2014-07-02 ENCOUNTER — Ambulatory Visit (INDEPENDENT_AMBULATORY_CARE_PROVIDER_SITE_OTHER): Payer: Medicaid Other | Admitting: Diagnostic Neuroimaging

## 2014-07-02 VITALS — BP 114/72 | HR 90 | Ht 65.0 in | Wt 160.4 lb

## 2014-07-02 DIAGNOSIS — G43009 Migraine without aura, not intractable, without status migrainosus: Secondary | ICD-10-CM

## 2014-07-02 MED ORDER — RIZATRIPTAN BENZOATE 10 MG PO TBDP: 10.0000 mg | ORAL_TABLET | ORAL | Status: DC | PRN

## 2014-07-02 MED ORDER — AMITRIPTYLINE HCL 25 MG PO TABS: 25.0000 mg | ORAL_TABLET | Freq: Every evening | ORAL | Status: DC

## 2014-07-02 NOTE — Progress Notes (Signed)
[facility]  PATIENT: Mackenzie [name] DOB: 10-Nov-1980  REFERRING CLINICIAN:  HISTORY FROM: patient  REASON FOR VISIT: follow up   HISTORICAL  CHIEF COMPLAINT:  Chief Complaint  Patient presents with  . Follow-up    migraines     HISTORY OF PRESENT ILLNESS:   UPDATE 07/02/14: Since last visit, on amitriptyline 25mg  every other night (~3x per week) and doing better. Cannot tolerate daily amitriptyline due to next day fatigue. Now having 1-2 HA per week. Previously having daily severe HA. Satisfied with amitriptyline.   PRIOR HPI (05/09/14): 34 year old right-handed female here for evaluation of headaches. Headache started age 71 years old with mild intermittent headaches. Over time they gradually worsened. Headaches significant worsened past few months and has been constant for past 2 weeks. Now she describes bitemporal throbbing severe headaches with photophobia. No phonophobia and nausea or vomiting. Chewing and eating seems to aggravate her pain. Patient has lost 13 pounds over past few weeks due to inability to eat. Patient has tried Tylenol, Motrin, Excedrin, without relief. She was prescribed Fioricet without relief. Patient has got to the emergency room several times for headache evaluation. No specific triggering factors for her headaches. Headaches are leading to insomnia. Patient also has diagnosis of keratosis, previously evaluated by ophthalmology in [place]. Patient moved to [facility] 1-1/2 years ago. She struggling to get established with PCP and ophthalmology here. Patient not able to work due to blurred vision.   REVIEW OF SYSTEMS: Full 14 system review of systems performed and notable only for light sens blurred vision eye pain headache excess eating.  ALLERGIES: No Known Allergies  HOME MEDICATIONS: Outpatient Prescriptions Prior to Visit  Medication Sig Dispense Refill  . butalbital-acetaminophen-caffeine (FIORICET) 50-325-40 MG per tablet  Take 1-2 tablets by mouth every 6 (six) hours as needed for headache. 20 tablet 0  . amitriptyline (ELAVIL) 25 MG tablet Take 1-2 tablets (25-50 mg total) by mouth every evening. 60 tablet 3  . predniSONE (DELTASONE) 10 MG tablet Take 60mg  on day 1. Reduce by 10mg  each subsequent day. (60, 50, 40, 30, 20, 10, stop) 21 tablet 0   No facility-administered medications prior to visit.    PAST MEDICAL HISTORY: Past Medical History  Diagnosis Date  . Migraines     PAST SURGICAL HISTORY: Past Surgical History  Procedure Laterality Date  . Cholecystectomy    . Uterine fibroid embolization    . Abdominal hysterectomy    . Hernia repair      2012 and July 2015    FAMILY HISTORY: Family History  Problem Relation Age of Onset  . Diabetes Maternal Grandmother   . Healthy Mother     SOCIAL HISTORY:  History   Social History  . Marital Status: Single    Spouse Name: N/A  . Number of Children: 2  . Years of Education: AS   Occupational History  . Not on file.   Social History Main Topics  . Smoking status: Never Smoker   . Smokeless tobacco: Never Used  . Alcohol Use: No  . Drug Use: No  . Sexual Activity: Yes   Other Topics Concern  . Not on file   Social History Narrative   Patient is right handed.   Patient does not drink caffeine.     PHYSICAL EXAM  Filed Vitals:   07/02/14 1004  BP: 114/72  Pulse: 90  Height: 5\' 5"  (1.651 m)  Weight: 160 lb 6.4 oz (72.757 kg)    Body  mass index is 26.69 kg/(m^2).  No exam data present  No flowsheet data found.  GENERAL EXAM: Patient is in no distress; well developed, nourished and groomed; neck is supple  CARDIOVASCULAR: Regular rate and rhythm, no murmurs, no carotid bruits  NEUROLOGIC: MENTAL STATUS: awake, alert, language fluent, comprehension intact, naming intact, fund of knowledge appropriate CRANIAL NERVE: pupils equal and reactive to light, visual fields full to confrontation, extraocular muscles intact,  no nystagmus, facial sensation and strength symmetric, hearing intact, palate elevates symmetrically, uvula midline, shoulder shrug symmetric, tongue midline. MOTOR: normal bulk and tone, full strength in the BUE, BLE SENSORY: normal and symmetric to light touch, pinprick, temperature, vibration COORDINATION: finger-nose-finger, fine finger movements  REFLEXES: deep tendon reflexes present and symmetric GAIT/STATION: narrow based gait; romberg is negative    DIAGNOSTIC DATA (LABS, IMAGING, TESTING) - I reviewed patient records, labs, notes, testing and imaging myself where available.  Lab Results  Component Value Date   WBC 6.8 05/01/2014   HGB 12.2 05/01/2014   HCT 38.3 05/01/2014   MCV 90.5 05/01/2014   PLT 263 05/01/2014      Component Value Date/Time   NA 141 05/01/2014 1819   K 3.9 05/01/2014 1819   CL 105 05/01/2014 1819   CO2 25 05/01/2014 1819   GLUCOSE 93 05/01/2014 1819   BUN 10 05/01/2014 1819   CREATININE 0.63 05/01/2014 1819   CALCIUM 8.9 05/01/2014 1819   PROT 7.1 05/01/2014 1819   ALBUMIN 3.5 05/01/2014 1819   AST 12 05/01/2014 1819   ALT 6 05/01/2014 1819   ALKPHOS 57 05/01/2014 1819   BILITOT <0.2* 05/01/2014 1819   GFRNONAA >90 05/01/2014 1819   GFRAA >90 05/01/2014 1819   No results found for: CHOL, HDL, LDLCALC, LDLDIRECT, TRIG, CHOLHDL No results found for: HGBA1C No results found for: VITAMINB12 No results found for: TSH   I reviewed images myself and agree with interpretation. -VRP  12/16/13 CT head - normal  05/24/14 MRI brain - normal     ASSESSMENT AND PLAN  34 y.o. year old female here with headaches since age 48 years old, worsening since fall/winter 2015, but improving since starting amitriptyline.  PLAN: - continue amitriptyline 25-50 mg at bedtime (every other day is ok for patient) - Continue NSAIDs when necessary headache - rizatriptan prn breakthrough HA  Meds ordered this encounter  Medications  . rizatriptan (MAXALT-MLT)  10 MG disintegrating tablet    Sig: Take 1 tablet (10 mg total) by mouth as needed for migraine. May repeat in 2 hours if needed    Dispense:  9 tablet    Refill:  11  . amitriptyline (ELAVIL) 25 MG tablet    Sig: Take 1-2 tablets (25-50 mg total) by mouth every evening.    Dispense:  60 tablet    Refill:  6   Return in about 3 months (around 09/30/2014).    Penni Bombard, MD 8/46/6599, 35:70 AM Certified in Neurology, Neurophysiology and Neuroimaging  [facility] [address] [place], [place] [address] [phone]

## 2014-10-24 ENCOUNTER — Ambulatory Visit: Payer: Medicaid Other | Admitting: Diagnostic Neuroimaging

## 2014-11-13 ENCOUNTER — Ambulatory Visit: Payer: Medicaid Other | Admitting: Diagnostic Neuroimaging

## 2014-11-17 ENCOUNTER — Encounter: Payer: Self-pay | Admitting: Diagnostic Neuroimaging

## 2015-01-20 ENCOUNTER — Encounter (INDEPENDENT_AMBULATORY_CARE_PROVIDER_SITE_OTHER): Payer: Medicaid Other | Admitting: Diagnostic Neuroimaging

## 2015-01-20 ENCOUNTER — Encounter: Payer: Self-pay | Admitting: Diagnostic Neuroimaging

## 2015-01-21 ENCOUNTER — Ambulatory Visit (INDEPENDENT_AMBULATORY_CARE_PROVIDER_SITE_OTHER): Payer: Medicaid Other | Admitting: Adult Health

## 2015-01-21 ENCOUNTER — Encounter: Payer: Self-pay | Admitting: Adult Health

## 2015-01-21 VITALS — Ht 65.0 in | Wt 174.0 lb

## 2015-01-21 DIAGNOSIS — G43009 Migraine without aura, not intractable, without status migrainosus: Secondary | ICD-10-CM | POA: Diagnosis not present

## 2015-01-21 DIAGNOSIS — G43909 Migraine, unspecified, not intractable, without status migrainosus: Secondary | ICD-10-CM | POA: Insufficient documentation

## 2015-01-21 MED ORDER — TOPIRAMATE 25 MG PO TABS: 25.0000 mg | ORAL_TABLET | Freq: Every day | ORAL | Status: DC

## 2015-01-21 MED ORDER — RIZATRIPTAN BENZOATE 10 MG PO TBDP: 10.0000 mg | ORAL_TABLET | ORAL | Status: DC | PRN

## 2015-01-21 NOTE — Progress Notes (Signed)
Megan, thank you for seeing this patient. I reviewed note and agree with plan.   Penni Bombard, MD 2/68/3419, 6:22 PM Certified in Neurology, Neurophysiology and Neuroimaging  [facility] [address], [place], [place] [address] [phone]

## 2015-01-21 NOTE — Patient Instructions (Signed)
Discontinue Amitritpyline Begin Topamax 25 mg at bedtime.  Maxalt 10 mg for an acute headache. To be taken at the first signs that a migraine is occuring- may repeat in 2 hours if not resolved.    Topiramate tablets What is this medicine? TOPIRAMATE (toe PYRE a mate) is used to treat seizures in adults or children with epilepsy. It is also used for the prevention of migraine headaches. This medicine may be used for other purposes; ask your health care provider or pharmacist if you have questions. COMMON BRAND NAME(S): Topamax, Topiragen What should I tell my health care provider before I take this medicine? They need to know if you have any of these conditions: -bleeding disorders -cirrhosis of the liver or liver disease -diarrhea -glaucoma -kidney stones or kidney disease -low blood counts, like low white cell, platelet, or red cell counts -lung disease like asthma, obstructive pulmonary disease, emphysema -metabolic acidosis -on a ketogenic diet -schedule for surgery or a procedure -suicidal thoughts, plans, or attempt; a previous suicide attempt by you or a family member -an unusual or allergic reaction to topiramate, other medicines, foods, dyes, or preservatives -pregnant or trying to get pregnant -breast-feeding How should I use this medicine? Take this medicine by mouth with a glass of water. Follow the directions on the prescription label. Do not crush or chew. You may take this medicine with meals. Take your medicine at regular intervals. Do not take it more often than directed. Talk to your pediatrician regarding the use of this medicine in children. Special care may be needed. While this drug may be prescribed for children as young as 8 years of age for selected conditions, precautions do apply. Overdosage: If you think you have taken too much of this medicine contact a poison control center or emergency room at once. NOTE: This medicine is only for you. Do not share this  medicine with others. What if I miss a dose? If you miss a dose, take it as soon as you can. If your next dose is to be taken in less than 6 hours, then do not take the missed dose. Take the next dose at your regular time. Do not take double or extra doses. What may interact with this medicine? Do not take this medicine with any of the following medications: -probenecid This medicine may also interact with the following medications: -acetazolamide -alcohol -amitriptyline -aspirin and aspirin-like medicines -birth control pills -certain medicines for depression -certain medicines for seizures -certain medicines that treat or prevent blood clots like warfarin, enoxaparin, dalteparin, apixaban, dabigatran, and rivaroxaban -digoxin -hydrochlorothiazide -lithium -medicines for pain, sleep, or muscle relaxation -metformin -methazolamide -NSAIDS, medicines for pain and inflammation, like ibuprofen or naproxen -pioglitazone -risperidone This list may not describe all possible interactions. Give your health care provider a list of all the medicines, herbs, non-prescription drugs, or dietary supplements you use. Also tell them if you smoke, drink alcohol, or use illegal drugs. Some items may interact with your medicine. What should I watch for while using this medicine? Visit your doctor or health care professional for regular checks on your progress. Do not stop taking this medicine suddenly. This increases the risk of seizures if you are using this medicine to control epilepsy. Wear a medical identification bracelet or chain to say you have epilepsy or seizures, and carry a card that lists all your medicines. This medicine can decrease sweating and increase your body temperature. Watch for signs of deceased sweating or fever, especially in children. Avoid extreme  heat, hot baths, and saunas. Be careful about exercising, especially in hot weather. Contact your health care provider right away if you  notice a fever or decrease in sweating. You should drink plenty of fluids while taking this medicine. If you have had kidney stones in the past, this will help to reduce your chances of forming kidney stones. If you have stomach pain, with nausea or vomiting and yellowing of your eyes or skin, call your doctor immediately. You may get drowsy, dizzy, or have blurred vision. Do not drive, use machinery, or do anything that needs mental alertness until you know how this medicine affects you. To reduce dizziness, do not sit or stand up quickly, especially if you are an older patient. Alcohol can increase drowsiness and dizziness. Avoid alcoholic drinks. If you notice blurred vision, eye pain, or other eye problems, seek medical attention at once for an eye exam. The use of this medicine may increase the chance of suicidal thoughts or actions. Pay special attention to how you are responding while on this medicine. Any worsening of mood, or thoughts of suicide or dying should be reported to your health care professional right away. This medicine may increase the chance of developing metabolic acidosis. If left untreated, this can cause kidney stones, bone disease, or slowed growth in children. Symptoms include breathing fast, fatigue, loss of appetite, irregular heartbeat, or loss of consciousness. Call your doctor immediately if you experience any of these side effects. Also, tell your doctor about any surgery you plan on having while taking this medicine since this may increase your risk for metabolic acidosis. Birth control pills may not work properly while you are taking this medicine. Talk to your doctor about using an extra method of birth control. Women who become pregnant while using this medicine may enroll in the [place] Pregnancy Registry by calling [phone]. This registry collects information about the safety of antiepileptic drug use during pregnancy. What side effects  may I notice from receiving this medicine? Side effects that you should report to your doctor or health care professional as soon as possible: -allergic reactions like skin rash, itching or hives, swelling of the face, lips, or tongue -decreased sweating and/or rise in body temperature -depression -difficulty breathing, fast or irregular breathing patterns -difficulty speaking -difficulty walking or controlling muscle movements -hearing impairment -redness, blistering, peeling or loosening of the skin, including inside the mouth -tingling, pain or numbness in the hands or feet -unusual bleeding or bruising -unusually weak or tired -worsening of mood, thoughts or actions of suicide or dying Side effects that usually do not require medical attention (report to your doctor or health care professional if they continue or are bothersome): -altered taste -back pain, joint or muscle aches and pains -diarrhea, or constipation -headache -loss of appetite -nausea -stomach upset, indigestion -tremors This list may not describe all possible side effects. Call your doctor for medical advice about side effects. You may report side effects to FDA at 1-800-FDA-1088. Where should I keep my medicine? Keep out of the reach of children. Store at room temperature between 15 and 30 degrees C (59 and 86 degrees F) in a tightly closed container. Protect from moisture. Throw away any unused medicine after the expiration date. NOTE: This sheet is a summary. It may not cover all possible information. If you have questions about this medicine, talk to your doctor, pharmacist, or health care provider.  2015, Elsevier/Gold Standard. (2013-05-13 23:17:57)

## 2015-01-21 NOTE — Progress Notes (Signed)
PATIENT: Mackenzie [name] DOB: 1980/09/03  REASON FOR VISIT: follow up- migraines HISTORY FROM: patient  HISTORY OF PRESENT ILLNESS: Mackenzie [name] is a 34 year old female with a history of migraine headaches. She returns today for follow-up. The patient is currently taking amitriptyline 25 mg. She states that she only takes this medication when she has a headache. She approximally takes it 2-3 times a week. She is unable to tolerate daily due to next day fatigue. She states that she has approximate 6 dull headaches a week and 2-3 severe headaches a week. Her headaches normally occur in the temporal region bilaterally. She does have photophobia but denies phonophobia. Denies nausea or vomiting. The patient expressed that she would like to try a different medication if possible. She is open to trying a daily preventative medication as long as it does not cause fatigue. She returns today for an evaluation.  HISTORY (PENUMALLI):  UPDATE 07/02/14: Since last visit, on amitriptyline 25mg  every other night (~3x per week) and doing better. Cannot tolerate daily amitriptyline due to next day fatigue. Now having 1-2 HA per week. Previously having daily severe HA. Satisfied with amitriptyline.   PRIOR HPI (05/09/14): 34 year old right-handed female here for evaluation of headaches. Headache started age 52 years old with mild intermittent headaches. Over time they gradually worsened. Headaches significant worsened past few months and has been constant for past 2 weeks. Now she describes bitemporal throbbing severe headaches with photophobia. No phonophobia and nausea or vomiting. Chewing and eating seems to aggravate her pain. Patient has lost 13 pounds over past few weeks due to inability to eat. Patient has tried Tylenol, Motrin, Excedrin, without relief. She was prescribed Fioricet without relief. Patient has got to the emergency room several times for headache evaluation. No specific triggering factors for her  headaches. Headaches are leading to insomnia. Patient also has diagnosis of keratosis, previously evaluated by ophthalmology in [place]. Patient moved to [facility] 1-1/2 years ago. She struggling to get established with PCP and ophthalmology here. Patient not able to work due to blurred vision.  REVIEW OF SYSTEMS: Out of a complete 14 system review of symptoms, the patient complains only of the following symptoms, and all other reviewed systems are negative.  See HPI  ALLERGIES: No Known Allergies  HOME MEDICATIONS: Outpatient Prescriptions Prior to Visit  Medication Sig Dispense Refill  . amitriptyline (ELAVIL) 25 MG tablet Take 1-2 tablets (25-50 mg total) by mouth every evening. 60 tablet 6  . butalbital-acetaminophen-caffeine (FIORICET) 50-325-40 MG per tablet Take 1-2 tablets by mouth every 6 (six) hours as needed for headache. (Patient not taking: Reported on 01/20/2015) 20 tablet 0  . rizatriptan (MAXALT-MLT) 10 MG disintegrating tablet Take 1 tablet (10 mg total) by mouth as needed for migraine. May repeat in 2 hours if needed (Patient not taking: Reported on 01/21/2015) 9 tablet 11   No facility-administered medications prior to visit.    PAST MEDICAL HISTORY: Past Medical History  Diagnosis Date  . Migraines   . Keratoconus of both eyes 2016    PAST SURGICAL HISTORY: Past Surgical History  Procedure Laterality Date  . Cholecystectomy    . Uterine fibroid embolization    . Abdominal hysterectomy    . Hernia repair      2012 and July 2015    FAMILY HISTORY: Family History  Problem Relation Age of Onset  . Diabetes Maternal Grandmother   . Healthy Mother     SOCIAL HISTORY: Social History   Social History  .  Marital Status: Single    Spouse Name: N/A  . Number of Children: 2  . Years of Education: AS   Occupational History  . Not on file.   Social History Main Topics  . Smoking status: Never Smoker   . Smokeless tobacco: Never Used  . Alcohol Use:  No  . Drug Use: No  . Sexual Activity: Yes   Other Topics Concern  . Not on file   Social History Narrative   Patient is single.   Patient works full time .   Education college.   Right handed.   Caffeine none               PHYSICAL EXAM  Filed Vitals:   01/21/15 0744  Height: 5\' 5"  (1.651 m)  Weight: 174 lb (78.926 kg)   Body mass index is 28.96 kg/(m^2).  Generalized: Well developed, in no acute distress   Neurological examination  Mentation: Alert oriented to time, place, history taking. Follows all commands speech and language fluent Cranial nerve II-XII:  Extraocular movements were full, visual field were full on confrontational test. Facial sensation and strength were normal. Uvula tongue midline. Head turning and shoulder shrug  were normal and symmetric. Motor: The motor testing reveals 5 over 5 strength of all 4 extremities. Good symmetric motor tone is noted throughout.  Sensory: Sensory testing is intact to soft touch on all 4 extremities. No evidence of extinction is noted.  Coordination: Cerebellar testing reveals good finger-nose-finger and heel-to-shin bilaterally.  Gait and station: Gait is normal. Tandem gait is normal. Romberg is negative. No drift is seen.  Reflexes: Deep tendon reflexes are symmetric and normal bilaterally.   DIAGNOSTIC DATA (LABS, IMAGING, TESTING) - I reviewed patient records, labs, notes, testing and imaging myself where available.     ASSESSMENT AND PLAN 34 y.o. year old female  has a past medical history of Migraines and Keratoconus of both eyes (2016). here with:  1. Migraines    Patient continues to have 2-3 severe headaches a week. She has approximately 6 dull headaches a week as well. The patient has only been taking amitriptyline when she has a headache. She cannot tolerate taking it daily. She would like to try something different. I will discontinue the amitriptyline. I will start her on Topamax 25 mg at bedtime to be  taken daily. I have reviewed the side effects with the patient. She expressed understanding. Maxalt was ordered for the patient at the last visit but she never received this. I will reorder this today. Patient advised that if her headache frequency and severity does not improve on Topamax she should let us know. She will follow-up in 3-4 months or sooner if needed.    Ward Givens, MSN, NP-C 01/21/2015, 8:13 AM [facility] [address] [place], [place] [address] [phone]

## 2015-01-23 NOTE — Progress Notes (Signed)
This encounter was created in error - please disregard.

## 2015-02-09 DIAGNOSIS — Z0271 Encounter for disability determination: Secondary | ICD-10-CM

## 2015-04-22 ENCOUNTER — Ambulatory Visit: Payer: Medicaid Other | Admitting: Adult Health

## 2015-04-23 ENCOUNTER — Encounter: Payer: Self-pay | Admitting: Adult Health

## 2015-11-03 ENCOUNTER — Encounter (HOSPITAL_COMMUNITY): Payer: Self-pay

## 2015-11-03 ENCOUNTER — Emergency Department (HOSPITAL_COMMUNITY)
Admission: EM | Admit: 2015-11-03 | Discharge: 2015-11-04 | Disposition: A | Discharge status: Home / Self Care | Payer: Self-pay | Attending: Emergency Medicine | Admitting: Emergency Medicine

## 2015-11-03 DIAGNOSIS — Y929 Unspecified place or not applicable: Secondary | ICD-10-CM | POA: Insufficient documentation

## 2015-11-03 DIAGNOSIS — S300XXA Contusion of lower back and pelvis, initial encounter: Secondary | ICD-10-CM | POA: Insufficient documentation

## 2015-11-03 DIAGNOSIS — Y9389 Activity, other specified: Secondary | ICD-10-CM | POA: Insufficient documentation

## 2015-11-03 DIAGNOSIS — W108XXA Fall (on) (from) other stairs and steps, initial encounter: Secondary | ICD-10-CM | POA: Insufficient documentation

## 2015-11-03 DIAGNOSIS — Y999 Unspecified external cause status: Secondary | ICD-10-CM | POA: Insufficient documentation

## 2015-11-03 DIAGNOSIS — M2550 Pain in unspecified joint: Secondary | ICD-10-CM | POA: Insufficient documentation

## 2015-11-03 MED ORDER — HYDROCODONE-ACETAMINOPHEN 5-325 MG PO TABS
1.0000 | ORAL_TABLET | Freq: Once | ORAL | Status: AC
  Administered 2015-11-04: 1 via ORAL
  Filled 2015-11-03: qty 1

## 2015-11-03 NOTE — ED Notes (Signed)
Bed: WA08 Expected date:  Expected time:  Means of arrival:  Comments: 35 yo F  Back pain

## 2015-11-03 NOTE — ED Provider Notes (Signed)
CSN: [id number]     Arrival date & time 11/03/15  2244 History  By signing my name below, I, Nicole Kindred, attest that this documentation has been prepared under the direction and in the presence of Charlann Lange, PA-C.  Electronically Signed: Nicole Kindred, ED Scribe 11/03/2015 at 11:34 PM.   Chief Complaint  Patient presents with  . Fall   The history is provided by the patient. No language interpreter was used.   HPI Comments: Mackenzie [name] is a 36 y.o. female who presents to the Emergency Department complaining of sudden onset, tailbone and right buttock pain s/p fall five days ago in which she landed on the area while trying to catch a child. No LOC or head trauma in the incident. Pt reports associated ecchymosis to the area. No other associated symptoms noted. No worsening or alleviating factors noted. Pt denies injury to other area, abdominal pain, chest pain, back pain, neck pain, or any other pertinent symptoms.  Past Medical History  Diagnosis Date  . Migraines   . Keratoconus of both eyes 2016   Past Surgical History  Procedure Laterality Date  . Cholecystectomy    . Uterine fibroid embolization    . Abdominal hysterectomy    . Hernia repair      2012 and July 2015   Family History  Problem Relation Age of Onset  . Diabetes Maternal Grandmother   . Healthy Mother    Social History  Substance Use Topics  . Smoking status: Never Smoker   . Smokeless tobacco: Never Used  . Alcohol Use: No   OB History    No data available     Review of Systems  Cardiovascular: Negative for chest pain.  Gastrointestinal: Negative for abdominal pain.  Musculoskeletal: Positive for myalgias and arthralgias. Negative for back pain and neck pain.  Skin: Positive for color change.     Allergies  Review of patient's allergies indicates no known allergies.  Home Medications   Prior to Admission medications   Medication Sig Start Date End Date Taking? Authorizing Provider   rizatriptan (MAXALT-MLT) 10 MG disintegrating tablet Take 1 tablet (10 mg total) by mouth as needed for migraine. May repeat in 2 hours if needed 01/21/15   Ward Givens, NP  topiramate (TOPAMAX) 25 MG tablet Take 1 tablet (25 mg total) by mouth at bedtime. 01/21/15   Ward Givens, NP   BP 108/97 mmHg  Pulse 89  Temp(Src) 97.9 F (36.6 C) (Oral)  Resp 18  SpO2 99%  LMP 01/10/2012 Physical Exam  Constitutional: She is oriented to person, place, and time. She appears well-developed and well-nourished.  HENT:  Head: Normocephalic and atraumatic.  Eyes: EOM are normal.  Neck: Normal range of motion.  Non-tender neck.  Pulmonary/Chest: Effort normal. She exhibits no tenderness.  Abdominal: She exhibits no distension. There is no tenderness.  Musculoskeletal: Normal range of motion.  No midline lumbar or sacral tenderness. Large area of bruising to central buttock without significant induration or skin break down. Lateral hip non-tender. Pt is ambulatory and fully weight bearing.   Neurological: She is alert and oriented to person, place, and time.  Psychiatric: She has a normal mood and affect.  Nursing note and vitals reviewed.   ED Course  Procedures (including critical care time) DIAGNOSTIC STUDIES: Oxygen Saturation is 99% on RA, normal by my interpretation.    COORDINATION OF CARE: 11:53 PM Discussed treatment plan with pt at bedside and pt agreed to plan.  Labs Review Labs  Reviewed - No data to display  Imaging Review No results found. I have personally reviewed and evaluated these images and lab results as part of my medical decision-making.   EKG Interpretation None     Dg Hip Unilat With Pelvis 2-3 Views Right  11/04/2015  CLINICAL DATA:  Right posterior and lateral hip pain and bruising after fall down stairs 3 days ago. Trip and fall injury. EXAM: DG HIP (WITH OR WITHOUT PELVIS) 2-3V RIGHT COMPARISON:  None. FINDINGS: There is no evidence of hip fracture or  dislocation. There is no evidence of arthropathy or other focal bone abnormality. IMPRESSION: Negative. Electronically Signed   By: Lucienne Capers M.D.   On: 11/04/2015 00:24    MDM   Final diagnoses:  None  1. Fall 2. Contusion right buttock  The patient presents 5 days after all with persistent pain. She has negative pelvis and hip imaging. Pain is improved. She is able to ambulate. She can be discharged home with something for pain.  I personally performed the services described in this documentation, which was scribed in my presence. The recorded information has been reviewed and is accurate.       Charlann Lange, PA-C 11/09/15 0023  Nat Christen, MD 11/09/15 [phone]

## 2015-11-03 NOTE — ED Notes (Signed)
Pt fell directly on her tailbone Friday and today she has a large bruise and it hurts to sit, she fell on wooden steps

## 2015-11-04 ENCOUNTER — Emergency Department (HOSPITAL_COMMUNITY): Payer: Self-pay

## 2015-11-04 MED ORDER — HYDROCODONE-ACETAMINOPHEN 5-325 MG PO TABS: 1.0000 | ORAL_TABLET | ORAL | Status: DC | PRN

## 2015-11-04 NOTE — Discharge Instructions (Signed)
Contusion °A contusion is a deep bruise. Contusions are the result of a blunt injury to tissues and muscle fibers under the skin. The injury causes bleeding under the skin. The skin overlying the contusion may turn blue, purple, or yellow. Minor injuries will give you a painless contusion, but more severe contusions may stay painful and swollen for a few weeks.  °CAUSES  °This condition is usually caused by a blow, trauma, or direct force to an area of the body. °SYMPTOMS  °Symptoms of this condition include: °· Swelling of the injured area. °· Pain and tenderness in the injured area. °· Discoloration. The area may have redness and then turn blue, purple, or yellow. °DIAGNOSIS  °This condition is diagnosed based on a physical exam and medical history. An X-ray, CT scan, or MRI may be needed to determine if there are any associated injuries, such as broken bones (fractures). °TREATMENT  °Specific treatment for this condition depends on what area of the body was injured. In general, the best treatment for a contusion is resting, icing, applying pressure to (compression), and elevating the injured area. This is often called the RICE strategy. Over-the-counter anti-inflammatory medicines may also be recommended for pain control.  °HOME CARE INSTRUCTIONS  °· Rest the injured area. °· If directed, apply ice to the injured area: °· Put ice in a plastic bag. °· Place a towel between your skin and the bag. °· Leave the ice on for 20 minutes, 2-3 times per day. °· If directed, apply light compression to the injured area using an elastic bandage. Make sure the bandage is not wrapped too tightly. Remove and reapply the bandage as directed by your health care provider. °· If possible, raise (elevate) the injured area above the level of your heart while you are sitting or lying down. °· Take over-the-counter and prescription medicines only as told by your health care provider. °SEEK MEDICAL CARE IF: °· Your symptoms do not  improve after several days of treatment. °· Your symptoms get worse. °· You have difficulty moving the injured area. °SEEK IMMEDIATE MEDICAL CARE IF:  °· You have severe pain. °· You have numbness in a hand or foot. °· Your hand or foot turns pale or cold. °  °This information is not intended to replace advice given to you by your health care provider. Make sure you discuss any questions you have with your health care provider. °  °Document Released: 02/16/2005 Document Revised: 01/28/2015 Document Reviewed: 09/24/2014 °Elsevier Interactive Patient Education ©2016 [employer] ° °Cryotherapy °Cryotherapy means treatment with cold. Ice or gel packs can be used to reduce both pain and swelling. Ice is the most helpful within the first 24 to 48 hours after an injury or flare-up from overusing a muscle or joint. Sprains, strains, spasms, burning pain, shooting pain, and aches can all be eased with ice. Ice can also be used when recovering from surgery. Ice is effective, has very few side effects, and is safe for most people to use. °PRECAUTIONS  °Ice is not a safe treatment option for people with: °· Raynaud phenomenon. This is a condition affecting small blood vessels in the extremities. Exposure to cold may cause your problems to return. °· Cold hypersensitivity. There are many forms of cold hypersensitivity, including: °¨ Cold urticaria. Red, itchy hives appear on the skin when the tissues begin to warm after being iced. °¨ Cold erythema. This is a red, itchy rash caused by exposure to cold. °¨ Cold hemoglobinuria. Red blood cells   break down when the tissues begin to warm after being iced. The hemoglobin that carry oxygen are passed into the urine because they cannot combine with blood proteins fast enough. °· Numbness or altered sensitivity in the area being iced. °If you have any of the following conditions, do not use ice until you have discussed cryotherapy with your caregiver: °· Heart conditions, such as  arrhythmia, angina, or chronic heart disease. °· High blood pressure. °· Healing wounds or open skin in the area being iced. °· Current infections. °· Rheumatoid arthritis. °· Poor circulation. °· Diabetes. °Ice slows the blood flow in the region it is applied. This is beneficial when trying to stop inflamed tissues from spreading irritating chemicals to surrounding tissues. However, if you expose your skin to cold temperatures for too long or without the proper protection, you can damage your skin or nerves. Watch for signs of skin damage due to cold. °HOME CARE INSTRUCTIONS °Follow these tips to use ice and cold packs safely. °· Place a dry or damp towel between the ice and skin. A damp towel will cool the skin more quickly, so you may need to shorten the time that the ice is used. °· For a more rapid response, add gentle compression to the ice. °· Ice for no more than 10 to 20 minutes at a time. The bonier the area you are icing, the less time it will take to get the benefits of ice. °· Check your skin after 5 minutes to make sure there are no signs of a poor response to cold or skin damage. °· Rest 20 minutes or more between uses. °· Once your skin is numb, you can end your treatment. You can test numbness by very lightly touching your skin. The touch should be so light that you do not see the skin dimple from the pressure of your fingertip. When using ice, most people will feel these normal sensations in this order: cold, burning, aching, and numbness. °· Do not use ice on someone who cannot communicate their responses to pain, such as small children or people with dementia. °HOW TO MAKE AN ICE PACK °Ice packs are the most common way to use ice therapy. Other methods include ice massage, ice baths, and cryosprays. Muscle creams that cause a cold, tingly feeling do not offer the same benefits that ice offers and should not be used as a substitute unless recommended by your caregiver. °To make an ice pack, do one  of the following: °· Place crushed ice or a bag of frozen vegetables in a sealable plastic bag. Squeeze out the excess air. Place this bag inside another plastic bag. Slide the bag into a pillowcase or place a damp towel between your skin and the bag. °· Mix 3 parts water with 1 part rubbing alcohol. Freeze the mixture in a sealable plastic bag. When you remove the mixture from the freezer, it will be slushy. Squeeze out the excess air. Place this bag inside another plastic bag. Slide the bag into a pillowcase or place a damp towel between your skin and the bag. °SEEK MEDICAL CARE IF: °· You develop white spots on your skin. This may give the skin a blotchy (mottled) appearance. °· Your skin turns blue or pale. °· Your skin becomes waxy or hard. °· Your swelling gets worse. °MAKE SURE YOU:  °· Understand these instructions. °· Will watch your condition. °· Will get help right away if you are not doing well or get worse. °  °  This information is not intended to replace advice given to you by your health care provider. Make sure you discuss any questions you have with your health care provider. °  °Document Released: 01/03/2011 Document Revised: 05/30/2014 Document Reviewed: 01/03/2011 °Elsevier Interactive Patient Education ©2016 Elsevier Inc. ° °

## 2016-02-07 ENCOUNTER — Emergency Department (HOSPITAL_COMMUNITY)
Admission: EM | Admit: 2016-02-07 | Discharge: 2016-02-08 | Disposition: A | Discharge status: Home / Self Care | Payer: Medicaid Other | Attending: Emergency Medicine | Admitting: Emergency Medicine

## 2016-02-07 ENCOUNTER — Encounter (HOSPITAL_COMMUNITY): Payer: Self-pay | Admitting: Nurse Practitioner

## 2016-02-07 DIAGNOSIS — Z79899 Other long term (current) drug therapy: Secondary | ICD-10-CM | POA: Insufficient documentation

## 2016-02-07 DIAGNOSIS — R1030 Lower abdominal pain, unspecified: Secondary | ICD-10-CM | POA: Diagnosis present

## 2016-02-07 DIAGNOSIS — N390 Urinary tract infection, site not specified: Secondary | ICD-10-CM | POA: Diagnosis not present

## 2016-02-07 LAB — COMPREHENSIVE METABOLIC PANEL
ALBUMIN: 4 g/dL (ref 3.5–5.0)
ALT: 12 U/L — AB (ref 14–54)
ANION GAP: 5 (ref 5–15)
AST: 14 U/L — AB (ref 15–41)
Alkaline Phosphatase: 51 U/L (ref 38–126)
BILIRUBIN TOTAL: 0.3 mg/dL (ref 0.3–1.2)
BUN: 12 mg/dL (ref 6–20)
CALCIUM: 8.8 mg/dL — AB (ref 8.9–10.3)
CO2: 25 mmol/L (ref 22–32)
CREATININE: 0.72 mg/dL (ref 0.44–1.00)
Chloride: 106 mmol/L (ref 101–111)
GFR calc Af Amer: >60 mL/min (ref >60)
GFR calc non Af Amer: >60 mL/min (ref >60)
Glucose, Bld: 94 mg/dL (ref 65–99)
Potassium: 3.7 mmol/L (ref 3.5–5.1)
SODIUM: 136 mmol/L (ref 135–145)
TOTAL PROTEIN: 7.2 g/dL (ref 6.5–8.1)

## 2016-02-07 LAB — URINALYSIS, ROUTINE W REFLEX MICROSCOPIC
Bilirubin Urine: NEGATIVE
Glucose, UA: NEGATIVE mg/dL
KETONES UR: NEGATIVE mg/dL
NITRITE: POSITIVE — AB
PH: 5.5 (ref 5.0–8.0)
PROTEIN: NEGATIVE mg/dL
Specific Gravity, Urine: 1.026 (ref 1.005–1.030)

## 2016-02-07 LAB — URINE MICROSCOPIC-ADD ON

## 2016-02-07 LAB — CBC WITH DIFFERENTIAL/PLATELET
BASOS PCT: 0 %
Basophils Absolute: 0 10*3/uL (ref 0.0–0.1)
EOS ABS: 0.1 10*3/uL (ref 0.0–0.7)
Eosinophils Relative: 2 %
HCT: 39.4 % (ref 36.0–46.0)
Hemoglobin: 12.6 g/dL (ref 12.0–15.0)
Lymphocytes Relative: 44 %
Lymphs Abs: 3.4 10*3/uL (ref 0.7–4.0)
MCH: 28.6 pg (ref 26.0–34.0)
MCHC: 32 g/dL (ref 30.0–36.0)
MCV: 89.3 fL (ref 78.0–100.0)
MONOS PCT: 6 %
Monocytes Absolute: 0.5 10*3/uL (ref 0.1–1.0)
NEUTROS ABS: 3.8 10*3/uL (ref 1.7–7.7)
NEUTROS PCT: 48 %
Platelets: 311 10*3/uL (ref 150–400)
RBC: 4.41 MIL/uL (ref 3.87–5.11)
RDW: 13.5 % (ref 11.5–15.5)
WBC: 7.9 10*3/uL (ref 4.0–10.5)

## 2016-02-07 LAB — PREGNANCY, URINE: Preg Test, Ur: NEGATIVE

## 2016-02-07 MED ORDER — DEXTROSE 5 % IV SOLN
1.0000 g | Freq: Once | INTRAVENOUS | Status: AC
  Administered 2016-02-07: 1 g via INTRAVENOUS
  Filled 2016-02-07: qty 10

## 2016-02-07 NOTE — ED Triage Notes (Signed)
Pt states 2 days ago she noticed she is having urinary urgency, frequency and what sounds like overactive bladder. She adds that she has had lower abdominal pain but denies N/V/D.

## 2016-02-07 NOTE — ED Provider Notes (Signed)
[facility] DEPT Provider Note   CSN: [id number] Arrival date & time: 02/07/16  [id number]  By signing my name below, I, [name], attest that this documentation has been prepared under the direction and in the presence of [employer], [place]. Electronically Signed: [name], ED Scribe. 02/07/16. 10:01 PM.    History   Chief Complaint Chief Complaint  Patient presents with  . Urinary Frequency  . Abdominal Pain    HPI Comments: Mackenzie [name] is a 35 y.o. female who presents to the Emergency Department complaining of constant suprapubic pain that started last night. She also reports right-sided abdominal pain X5 days; notes pain is to the site of previous hernia repair surgery. Her pain is precipitated by bending down or stretching her abdomen. She says she had a fever 3 days ago that has now resolved. Pt has taken Tylenol for her pain with minimal relief. Pt has had other abdominal surgeries including partial hysterectomy, cholecystectomy, and bladder surgery. She has had 3 episodes of urinary incontinence over the last 2 days. Pt denies vaginal bleeding, urinary frequency, urgency, hematuria, back pain, nausea, vomiting, incontinence of stool, and numbness and tingling in her extremities.  The history is provided by the patient. No language interpreter was used.    Past Medical History:  Diagnosis Date  . Keratoconus of both eyes 2016  . Migraines     Patient Active Problem List   Diagnosis Date Noted  . Migraines   . Migraine without aura and without status migrainosus, not intractable 07/02/2014  . Blurred vision 05/09/2014  . Keratosis 05/09/2014    Past Surgical History:  Procedure Laterality Date  . ABDOMINAL HYSTERECTOMY    . CHOLECYSTECTOMY    . HERNIA REPAIR     2012 and July 2015  . UTERINE FIBROID EMBOLIZATION      OB History    No data available       Home Medications    Prior to Admission medications   Medication Sig Start Date End Date  Taking? Authorizing Provider  cephALEXin (KEFLEX) 500 MG capsule Take 1 capsule (500 mg total) by mouth 2 (two) times daily. 02/08/16   Antonietta Breach, PA-C  HYDROcodone-acetaminophen (NORCO/VICODIN) 5-325 MG tablet Take 1-2 tablets by mouth every 4 (four) hours as needed. 11/04/15   Charlann Lange, PA-C  naproxen (NAPROSYN) 500 MG tablet Take 1 tablet (500 mg total) by mouth 2 (two) times daily. 02/08/16   Antonietta Breach, PA-C  phenazopyridine (PYRIDIUM) 200 MG tablet Take 1 tablet (200 mg total) by mouth 3 (three) times daily. 02/08/16   Antonietta Breach, PA-C  rizatriptan (MAXALT-MLT) 10 MG disintegrating tablet Take 1 tablet (10 mg total) by mouth as needed for migraine. May repeat in 2 hours if needed 01/21/15   Ward Givens, NP  topiramate (TOPAMAX) 25 MG tablet Take 1 tablet (25 mg total) by mouth at bedtime. 01/21/15   Ward Givens, NP    Family History Family History  Problem Relation Age of Onset  . Healthy Mother   . Diabetes Maternal Grandmother     Social History Social History  Substance Use Topics  . Smoking status: Never Smoker  . Smokeless tobacco: Never Used  . Alcohol use No     Allergies   Review of patient's allergies indicates no known allergies.   Review of Systems Review of Systems A complete 10 system review of systems was obtained and all systems are negative except as noted in the HPI and PMH.    Physical Exam  Updated Vital Signs BP 116/70 (BP Location: Left Arm)   Pulse 68   Temp 97.9 F (36.6 C) (Oral)   Resp 18   Ht 5\' 5"  (1.651 m)   Wt 79.4 kg   LMP 01/10/2012   SpO2 96%   BMI 29.12 kg/m   Physical Exam  Constitutional: She is oriented to person, place, and time. She appears well-developed and well-nourished. No distress.  Nontoxic appearing and in no distress  HENT:  Head: Normocephalic and atraumatic.  Eyes: Conjunctivae and EOM are normal. No scleral icterus.  Neck: Normal range of motion.  Cardiovascular: Normal rate, regular rhythm and  intact distal pulses.   Pulmonary/Chest: Effort normal. No respiratory distress. She has no wheezes. She has no rales.  Respirations even and unlabored  Abdominal: Soft. She exhibits no distension and no mass. There is tenderness. There is no rebound and no guarding.  Soft abdomen with suprapubic TTP and TTP to the right of the umbilicus. No palpable masses. No palpable hernia; no firmness, erythema, or other discoloration to the abdominal wall. No peritoneal signs. No CVA TTP.  Musculoskeletal: Normal range of motion. She exhibits no tenderness.  No TTP to the low back. No bony deformities, step-offs, or crepitus to the lumbosacral midline.  Neurological: She is alert and oriented to person, place, and time.  GCS 15. Patient moving all extremities. She ambulates with steady gait, independently.  Skin: Skin is warm and dry. No rash noted. She is not diaphoretic. No erythema. No pallor.  Psychiatric: She has a normal mood and affect. Her behavior is normal.  Nursing note and vitals reviewed.    ED Treatments / Results   DIAGNOSTIC STUDIES: Oxygen Saturation is 100% on RA, normal by my interpretation.    COORDINATION OF CARE: 10:22 PM Discussed treatment plan with pt at bedside and pt agreed to plan.   Labs (all labs ordered are listed, but only abnormal results are displayed) Labs Reviewed  URINALYSIS, ROUTINE W REFLEX MICROSCOPIC (NOT AT [facility]) - Abnormal; Notable for the following:       Result Value   APPearance CLOUDY (*)    Hgb urine dipstick SMALL (*)    Nitrite POSITIVE (*)    Leukocytes, UA MODERATE (*)    All other components within normal limits  COMPREHENSIVE METABOLIC PANEL - Abnormal; Notable for the following:    Calcium 8.8 (*)    AST 14 (*)    ALT 12 (*)    All other components within normal limits  URINE MICROSCOPIC-ADD ON - Abnormal; Notable for the following:    Squamous Epithelial / LPF 0-5 (*)    Bacteria, UA MANY (*)    All other components within normal  limits  URINE CULTURE  PREGNANCY, URINE  CBC WITH DIFFERENTIAL/PLATELET    EKG  EKG Interpretation None       Radiology No results found.  Procedures Procedures (including critical care time)  Medications Ordered in ED Medications  cefTRIAXone (ROCEPHIN) 1 g in dextrose 5 % 50 mL IVPB (1 g Intravenous New Bag/Given 02/07/16 2355)  ketorolac (TORADOL) 30 MG/ML injection 30 mg (30 mg Intravenous Given 02/08/16 0021)     Initial Impression / Assessment and Plan / ED Course  I have reviewed the triage vital signs and the nursing notes.  Pertinent labs & imaging results that were available during my care of the patient were reviewed by me and considered in my medical decision making (see chart for details).  Clinical Course  Pt has been diagnosed with a UTI. Pt is afebrile, no CVA tenderness, normotensive, and denies N/V. She reports urinary incontinence; however I believe this to be secondary to UTI. No other signs concerning for cauda equina. Patient denies paresthesias and numbness in her extremities. She ambulates without difficulty. She has had no stool incontinence. Also doubt epidural abscess or MS. Pt to be discharged home with antibiotics and instructions to follow up with PCP if symptoms persist. Return precautions discussed and provided. Patient discharged in satisfactory condition with no unaddressed concerns. Case discussed also with my attending, Dr. [name], who is in agreement with this work up, assessment, management plan, and patient's stability for discharge.   Final Clinical Impressions(s) / ED Diagnoses   Final diagnoses:  UTI (lower urinary tract infection)    New Prescriptions New Prescriptions   CEPHALEXIN (KEFLEX) 500 MG CAPSULE    Take 1 capsule (500 mg total) by mouth 2 (two) times daily.   NAPROXEN (NAPROSYN) 500 MG TABLET    Take 1 tablet (500 mg total) by mouth 2 (two) times daily.   PHENAZOPYRIDINE (PYRIDIUM) 200 MG TABLET    Take 1 tablet  (200 mg total) by mouth 3 (three) times daily.    I personally performed the services described in this documentation, which was scribed in my presence. The recorded information has been reviewed and is accurate.        Antonietta Breach, PA-C 02/08/16 [id number]    Charlesetta Shanks, MD 02/09/16 [phone]

## 2016-02-08 MED ORDER — NAPROXEN 500 MG PO TABS: 500.0000 mg | ORAL_TABLET | Freq: Two times a day (BID) | ORAL | 0 refills | Status: DC

## 2016-02-08 MED ORDER — KETOROLAC TROMETHAMINE 30 MG/ML IJ SOLN
30.0000 mg | Freq: Once | INTRAMUSCULAR | Status: AC
  Administered 2016-02-08: 30 mg via INTRAVENOUS
  Filled 2016-02-08: qty 1

## 2016-02-08 MED ORDER — CEPHALEXIN 500 MG PO CAPS: 500.0000 mg | ORAL_CAPSULE | Freq: Two times a day (BID) | ORAL | 0 refills | Status: DC

## 2016-02-08 MED ORDER — PHENAZOPYRIDINE HCL 200 MG PO TABS: 200.0000 mg | ORAL_TABLET | Freq: Three times a day (TID) | ORAL | 0 refills | Status: DC

## 2016-02-08 NOTE — Discharge Instructions (Signed)
Your workup today revealed a urinary tract infection. This is likely the cause of your abdominal pain and incontinence. We recommend that you take Keflex until finished. You may take Pyridium for bladder spasms, as needed. Take Naproxen for pain. Follow-up with her primary care doctor regarding your ED visit today. Return for new or concerning symptoms such as fever, persistent incontinence, stool incontinence, weakness or numbness in your arms or legs, trouble walking, vomiting, or severe worsening of your abdominal pain.

## 2016-02-08 NOTE — ED Notes (Signed)
No reaction to medication noted alert and oriented x 3 call light in reach.

## 2016-02-10 LAB — URINE CULTURE: Culture: >=100000 — AB

## 2016-02-11 ENCOUNTER — Telehealth (HOSPITAL_BASED_OUTPATIENT_CLINIC_OR_DEPARTMENT_OTHER): Payer: Self-pay | Admitting: Emergency Medicine

## 2016-02-11 NOTE — Telephone Encounter (Signed)
Post ED Visit - Positive Culture Follow-up  Culture report reviewed by antimicrobial stewardship pharmacist:  []  Elenor Quinones, Pharm.D. []  Heide Guile, Pharm.D., BCPS []  Parks Neptune, Pharm.D. []  Alycia Rossetti, Pharm.D., BCPS []  [place], Pharm.D., BCPS, AAHIVP []  Legrand Como, Pharm.D., BCPS, AAHIVP []  Milus Glazier, Pharm.D. []  Stephens November, Pharm.D. Dimitri Ped PharmD  Positive urine culture Treated with cephalexin, organism sensitive to the same and no further patient follow-up is required at this time.  Hazle Nordmann 02/11/2016, 10:14 AM

## 2016-06-14 ENCOUNTER — Emergency Department (HOSPITAL_COMMUNITY): Payer: Medicaid Other

## 2016-06-14 ENCOUNTER — Emergency Department (HOSPITAL_COMMUNITY)
Admission: EM | Admit: 2016-06-14 | Discharge: 2016-06-14 | Disposition: A | Discharge status: Home / Self Care | Payer: Medicaid Other | Attending: Emergency Medicine | Admitting: Emergency Medicine

## 2016-06-14 ENCOUNTER — Encounter (HOSPITAL_COMMUNITY): Payer: Self-pay | Admitting: Emergency Medicine

## 2016-06-14 DIAGNOSIS — M545 Low back pain, unspecified: Secondary | ICD-10-CM

## 2016-06-14 DIAGNOSIS — R319 Hematuria, unspecified: Secondary | ICD-10-CM | POA: Diagnosis not present

## 2016-06-14 DIAGNOSIS — N63 Unspecified lump in unspecified breast: Secondary | ICD-10-CM | POA: Insufficient documentation

## 2016-06-14 DIAGNOSIS — R509 Fever, unspecified: Secondary | ICD-10-CM

## 2016-06-14 DIAGNOSIS — N6310 Unspecified lump in the right breast, unspecified quadrant: Secondary | ICD-10-CM

## 2016-06-14 DIAGNOSIS — K5900 Constipation, unspecified: Secondary | ICD-10-CM | POA: Diagnosis not present

## 2016-06-14 LAB — BASIC METABOLIC PANEL
Anion gap: 8 (ref 5–15)
BUN: 6 mg/dL (ref 6–20)
CALCIUM: 8.6 mg/dL — AB (ref 8.9–10.3)
CHLORIDE: 106 mmol/L (ref 101–111)
CO2: 23 mmol/L (ref 22–32)
CREATININE: 0.67 mg/dL (ref 0.44–1.00)
GFR calc Af Amer: >60 mL/min (ref >60)
Glucose, Bld: 112 mg/dL — ABNORMAL HIGH (ref 65–99)
Potassium: 3.1 mmol/L — ABNORMAL LOW (ref 3.5–5.1)
SODIUM: 137 mmol/L (ref 135–145)

## 2016-06-14 LAB — URINALYSIS, ROUTINE W REFLEX MICROSCOPIC
BACTERIA UA: NONE SEEN
Bilirubin Urine: NEGATIVE
GLUCOSE, UA: NEGATIVE mg/dL
KETONES UR: 20 mg/dL — AB
Leukocytes, UA: NEGATIVE
Nitrite: NEGATIVE
PROTEIN: NEGATIVE mg/dL
Specific Gravity, Urine: 1.026 (ref 1.005–1.030)
pH: 5 (ref 5.0–8.0)

## 2016-06-14 LAB — CBC WITH DIFFERENTIAL/PLATELET
BASOS PCT: 0 %
Basophils Absolute: 0 10*3/uL (ref 0.0–0.1)
Eosinophils Absolute: 0 10*3/uL (ref 0.0–0.7)
Eosinophils Relative: 0 %
HCT: 36.6 % (ref 36.0–46.0)
HEMOGLOBIN: 12.1 g/dL (ref 12.0–15.0)
Lymphocytes Relative: 33 %
Lymphs Abs: 1.7 10*3/uL (ref 0.7–4.0)
MCH: 28.3 pg (ref 26.0–34.0)
MCHC: 33.1 g/dL (ref 30.0–36.0)
MCV: 85.7 fL (ref 78.0–100.0)
MONOS PCT: 15 %
Monocytes Absolute: 0.7 10*3/uL (ref 0.1–1.0)
NEUTROS PCT: 52 %
Neutro Abs: 2.6 10*3/uL (ref 1.7–7.7)
Platelets: 277 10*3/uL (ref 150–400)
RBC: 4.27 MIL/uL (ref 3.87–5.11)
RDW: 13.7 % (ref 11.5–15.5)
WBC: 5.1 10*3/uL (ref 4.0–10.5)

## 2016-06-14 LAB — POC URINE PREG, ED: Preg Test, Ur: NEGATIVE

## 2016-06-14 MED ORDER — ACETAMINOPHEN 325 MG PO TABS
650.0000 mg | ORAL_TABLET | Freq: Once | ORAL | Status: AC
  Administered 2016-06-14: 650 mg via ORAL
  Filled 2016-06-14: qty 2

## 2016-06-14 MED ORDER — POTASSIUM CHLORIDE CRYS ER 20 MEQ PO TBCR
40.0000 meq | EXTENDED_RELEASE_TABLET | Freq: Once | ORAL | Status: AC
  Administered 2016-06-14: 40 meq via ORAL
  Filled 2016-06-14: qty 2

## 2016-06-14 MED ORDER — METHOCARBAMOL 500 MG PO TABS: 500.0000 mg | ORAL_TABLET | Freq: Two times a day (BID) | ORAL | 0 refills | Status: DC

## 2016-06-14 NOTE — ED Triage Notes (Addendum)
Patient reports fever and back pain x2 days. Denies injury. Ambulatory to triage. Denies cough, congestion, abdominal pain, N/V/D. Reports alternating tylenol and motrin at home.

## 2016-06-14 NOTE — Discharge Instructions (Signed)
Take Tylenol and Motrin, alternate every 3 hours. This will help with fever and pain. Take Robaxin as prescribed as needed for muscle spasms. Try heating pad to the back. Please follow with her primary care doctor to recheck urinalysis and for further evaluation of the right breast mass. Return to the hospital if any numbness, weakness, pain in your lower extremities. If fever is worsening. Or if developed any new concerning symptoms.

## 2016-06-14 NOTE — ED Provider Notes (Signed)
[facility] DEPT Provider Note   CSN: [id number] Arrival date & time: 06/14/16  1919     History   Chief Complaint Chief Complaint  Patient presents with  . Fever  . Back Pain    HPI Mackenzie [name] is a 36 y.o. female.  HPI Mackenzie [name] is a 36 y.o. female presents to emergency department complaining of fever and back pain for 2 days. Patient states her fever is as high as 104 at home. States she has been taking ibuprofen for this which has helped. Patient denies any nasal congestion, sore throat, cough. She does report diffuse back pain. This back pain is described as sharp. It does not radiate. Denies any numbness or tingling in her extremities. She denies any injuries. She denies any urinary symptoms. No vaginal discharge or bleeding. She is status post hysterectomy. She denies any abdominal pain. She states she has had fever this high in the past and was diagnosed with pneumonia. She has taken ibuprofen just prior to coming in. Denies any ill contacts.   Past Medical History:  Diagnosis Date  . Keratoconus of both eyes 2016  . Migraines     Patient Active Problem List   Diagnosis Date Noted  . Migraines   . Migraine without aura and without status migrainosus, not intractable 07/02/2014  . Blurred vision 05/09/2014  . Keratosis 05/09/2014    Past Surgical History:  Procedure Laterality Date  . ABDOMINAL HYSTERECTOMY    . CHOLECYSTECTOMY    . HERNIA REPAIR     2012 and July 2015  . UTERINE FIBROID EMBOLIZATION      OB History    No data available       Home Medications    Prior to Admission medications   Medication Sig Start Date End Date Taking? Authorizing Provider  cephALEXin (KEFLEX) 500 MG capsule Take 1 capsule (500 mg total) by mouth 2 (two) times daily. 02/08/16   Antonietta Breach, PA-C  HYDROcodone-acetaminophen (NORCO/VICODIN) 5-325 MG tablet Take 1-2 tablets by mouth every 4 (four) hours as needed. 11/04/15   Charlann Lange, PA-C  naproxen (NAPROSYN) 500  MG tablet Take 1 tablet (500 mg total) by mouth 2 (two) times daily. 02/08/16   Antonietta Breach, PA-C  phenazopyridine (PYRIDIUM) 200 MG tablet Take 1 tablet (200 mg total) by mouth 3 (three) times daily. 02/08/16   Antonietta Breach, PA-C  rizatriptan (MAXALT-MLT) 10 MG disintegrating tablet Take 1 tablet (10 mg total) by mouth as needed for migraine. May repeat in 2 hours if needed 01/21/15   Ward Givens, NP  topiramate (TOPAMAX) 25 MG tablet Take 1 tablet (25 mg total) by mouth at bedtime. 01/21/15   Ward Givens, NP    Family History Family History  Problem Relation Age of Onset  . Healthy Mother   . Diabetes Maternal Grandmother     Social History Social History  Substance Use Topics  . Smoking status: Never Smoker  . Smokeless tobacco: Never Used  . Alcohol use No     Allergies   Patient has no known allergies.   Review of Systems Review of Systems  Constitutional: Positive for chills and fever.  Respiratory: Negative for cough, chest tightness and shortness of breath.   Cardiovascular: Negative for chest pain, palpitations and leg swelling.  Gastrointestinal: Negative for abdominal pain, diarrhea, nausea and vomiting.  Genitourinary: Negative for dysuria, flank pain, pelvic pain, vaginal bleeding, vaginal discharge and vaginal pain.  Musculoskeletal: Positive for back pain. Negative for arthralgias, myalgias, neck pain  and neck stiffness.  Skin: Negative for rash.  Neurological: Negative for dizziness, weakness and headaches.  All other systems reviewed and are negative.    Physical Exam Updated Vital Signs BP 119/75 (BP Location: Left Arm)   Pulse 114   Temp 100.2 F (37.9 C) (Oral)   Resp 18   Ht 5\' 5"  (1.651 m)   Wt 76.2 kg   LMP 01/10/2012   SpO2 100%   BMI 27.96 kg/m   Physical Exam  Constitutional: She is oriented to person, place, and time. She appears well-developed and well-nourished. No distress.  HENT:  Head: Normocephalic.  Eyes: Conjunctivae are  normal.  Neck: Neck supple.  Cardiovascular: Normal rate, regular rhythm and normal heart sounds.   Pulmonary/Chest: Effort normal and breath sounds normal. No respiratory distress. She has no wheezes. She has no rales.  Abdominal: Soft. Bowel sounds are normal. She exhibits no distension. There is no tenderness. There is no rebound.  CVA tenderness bilaterally  Musculoskeletal: She exhibits no edema.  Diffuse midline and paraspinal lumbar and thoracic back tenderness.  Neurological: She is alert and oriented to person, place, and time.  5/5 and equal upper and lower extremity strength bilaterally. Equal grip strength bilaterally. Normal finger to nose and heel to shin. No pronator drift. Patellar reflexes 2+   Skin: Skin is warm and dry.  Psychiatric: She has a normal mood and affect. Her behavior is normal.  Nursing note and vitals reviewed.    ED Treatments / Results  Labs (all labs ordered are listed, but only abnormal results are displayed) Labs Reviewed  URINALYSIS, ROUTINE W REFLEX MICROSCOPIC - Abnormal; Notable for the following:       Result Value   Hgb urine dipstick MODERATE (*)    Ketones, ur 20 (*)    Squamous Epithelial / LPF 0-5 (*)    All other components within normal limits  BASIC METABOLIC PANEL - Abnormal; Notable for the following:    Potassium 3.1 (*)    Glucose, Bld 112 (*)    Calcium 8.6 (*)    All other components within normal limits  CBC WITH DIFFERENTIAL/PLATELET  POC URINE PREG, ED    EKG  EKG Interpretation None       Radiology Dg Chest 2 View  Result Date: 06/14/2016 CLINICAL DATA:  Shortness of breath with fever and back pain for 2 days. EXAM: CHEST  2 VIEW COMPARISON:  None. FINDINGS: The heart size and mediastinal contours are within normal limits. Both lungs are clear. The visualized skeletal structures are unremarkable. IMPRESSION: No active cardiopulmonary disease. Electronically Signed   By: Misty Stanley M.D.   On: 06/14/2016 20:56     Procedures Procedures (including critical care time)  Medications Ordered in ED Medications  potassium chloride SA (K-DUR,KLOR-CON) CR tablet 40 mEq (not administered)  acetaminophen (TYLENOL) tablet 650 mg (650 mg Oral Given 06/14/16 2051)     Initial Impression / Assessment and Plan / ED Course  I have reviewed the triage vital signs and the nursing notes.  Pertinent labs & imaging results that were available during my care of the patient were reviewed by me and considered in my medical decision making (see chart for details).    Patient is a emergency department with fever that she reports it was up to 104 at home, no URI symptoms, lower back pain, no urinary symptoms, no abdominal pain or tenderness. Patient's pain is worsened with movement, but also is tender to palpation diffusely over her  entire lower back. There is no neurological complaints. No neurological findings. No concern for cauda equina or cord compression. She does have temperature 100.2 here with mild tachycardia. Will treat with Tylenol. I will check labs, chest x-ray, urinalysis.  Chest x-ray unremarkable. Lab showing potassium of 3.1. White blood cell count is normal. Patient does have moderate hemoglobin on her urinalysis but no red blood cells. Considered rhabdomyolysis, however patient has no risk factors for rhabdomyolysis, no recent exercise, no nausea or vomiting, no new medications. CT abdomen and pelvis was obtained to rule out a kidney stone and is negative. Discussed finding of constipation a right breast mass. Patient is recheck vitals showed normal heart rate, normal temperature. Patient is in no acute distress. I did consider discitis versus epidural abscess, however patient has no risk factors and exam is not consistent with this type of process. I did discuss all symptoms and signs that would prompt her return back to emergency department. This time patient is stable for discharge home with close outpatient  follow-up. She will follow-up with her primary care doctor. Itchy back pain with Robaxin, Tylenol and Motrin.  Vitals:   06/14/16 1927 06/14/16 2129  BP: 119/75 109/78  Pulse: 114 88  Resp: 18 20  Temp: 100.2 F (37.9 C) 98.4 F (36.9 C)      Final Clinical Impressions(s) / ED Diagnoses   Final diagnoses:  Fever, unspecified fever cause  Acute bilateral low back pain without sciatica  Breast mass, right  Constipation, unspecified constipation type  Hematuria, unspecified type    New Prescriptions New Prescriptions   METHOCARBAMOL (ROBAXIN) 500 MG TABLET    Take 1 tablet (500 mg total) by mouth 2 (two) times daily.     Jeannett Senior, PA-C 06/14/16 [place], MD 06/15/16 [phone]

## 2016-09-12 ENCOUNTER — Ambulatory Visit (INDEPENDENT_AMBULATORY_CARE_PROVIDER_SITE_OTHER): Payer: Medicaid Other | Admitting: Diagnostic Neuroimaging

## 2016-09-12 ENCOUNTER — Encounter (INDEPENDENT_AMBULATORY_CARE_PROVIDER_SITE_OTHER): Payer: Self-pay

## 2016-09-12 ENCOUNTER — Encounter: Payer: Self-pay | Admitting: Diagnostic Neuroimaging

## 2016-09-12 VITALS — BP 106/63 | HR 90 | Ht 65.0 in | Wt 182.6 lb

## 2016-09-12 DIAGNOSIS — Z6830 Body mass index (BMI) 30.0-30.9, adult: Secondary | ICD-10-CM | POA: Diagnosis not present

## 2016-09-12 DIAGNOSIS — G4730 Sleep apnea, unspecified: Secondary | ICD-10-CM | POA: Diagnosis not present

## 2016-09-12 DIAGNOSIS — G43009 Migraine without aura, not intractable, without status migrainosus: Secondary | ICD-10-CM

## 2016-09-12 MED ORDER — TOPIRAMATE 50 MG PO TABS: 50.0000 mg | ORAL_TABLET | Freq: Every day | ORAL | 6 refills | Status: DC

## 2016-09-12 NOTE — Progress Notes (Signed)
[facility]  PATIENT: Mackenzie [name] DOB: 1981-04-09  REFERRING CLINICIAN:  HISTORY FROM: patient  REASON FOR VISIT: follow up   HISTORICAL  CHIEF COMPLAINT:  Chief Complaint  Patient presents with  . Follow-up  . Headaches    Last seen 2016.  She states maxalt did not help, she did not refill topamax.Having migraines 4 x week, other times daily chronic headache.  ,  Taking execedrin prn.     HISTORY OF PRESENT ILLNESS:   UPDATE 09/12/16 (VRP): Since last visit, was doing well. Had daith (Apr 2017) piercing and had excellent results, then developed keloid and had piercing removed (Feb 2018). Now HA are returning. Now daily lower level headaches severe migraine 4x / week. Also pt fiance has noted that pt having some restless during sleep, apnea, gasping during last 6 months. Pt has daytime sleepiness. Has had 20lb weight gain in last 6 months.   UPDATE 01/21/15 (MM): "36 year old female with a history of migraine headaches. She returns today for follow-up. The patient is currently taking amitriptyline 25 mg. She states that she only takes this medication when she has a headache. She approximally takes it 2-3 times a week. She is unable to tolerate daily due to next day fatigue. She states that she has approximate 6 dull headaches a week and 2-3 severe headaches a week. Her headaches normally occur in the temporal region bilaterally. She does have photophobia but denies phonophobia. Denies nausea or vomiting. The patient expressed that she would like to try a different medication if possible. She is open to trying a daily preventative medication as long as it does not cause fatigue. She returns today for an evaluation."  UPDATE 07/02/14: Since last visit, on amitriptyline 25mg  every other night (~3x per week) and doing better. Cannot tolerate daily amitriptyline due to next day fatigue. Now having 1-2 HA per week. Previously having daily severe HA. Satisfied with  amitriptyline.   PRIOR HPI (05/09/14): 36 year old right-handed female here for evaluation of headaches. Headache started age 5 years old with mild intermittent headaches. Over time they gradually worsened. Headaches significant worsened past few months and has been constant for past 2 weeks. Now she describes bitemporal throbbing severe headaches with photophobia. No phonophobia and nausea or vomiting. Chewing and eating seems to aggravate her pain. Patient has lost 13 pounds over past few weeks due to inability to eat. Patient has tried Tylenol, Motrin, Excedrin, without relief. She was prescribed Fioricet without relief. Patient has got to the emergency room several times for headache evaluation. No specific triggering factors for her headaches. Headaches are leading to insomnia. Patient also has diagnosis of keratosis, previously evaluated by ophthalmology in [place]. Patient moved to [facility] 1-1/2 years ago. She struggling to get established with PCP and ophthalmology here. Patient not able to work due to blurred vision.   REVIEW OF SYSTEMS: Full 14 system review of systems performed and negative except: insomnia pnea dizziness headache excess eating eye pain blurred vision excess sweating excess eating.    ALLERGIES: No Known Allergies  HOME MEDICATIONS: Outpatient Medications Prior to Visit  Medication Sig Dispense Refill  . rizatriptan (MAXALT-MLT) 10 MG disintegrating tablet Take 1 tablet (10 mg total) by mouth as needed for migraine. May repeat in 2 hours if needed (Patient not taking: Reported on 09/12/2016) 9 tablet 11  . topiramate (TOPAMAX) 25 MG tablet Take 1 tablet (25 mg total) by mouth at bedtime. (Patient not taking: Reported on 09/12/2016) 30 tablet 3  .  cephALEXin (KEFLEX) 500 MG capsule Take 1 capsule (500 mg total) by mouth 2 (two) times daily. (Patient not taking: Reported on 09/12/2016) 14 capsule 0  . HYDROcodone-acetaminophen (NORCO/VICODIN) 5-325 MG tablet Take 1-2  tablets by mouth every 4 (four) hours as needed. (Patient not taking: Reported on 09/12/2016) 12 tablet 0  . methocarbamol (ROBAXIN) 500 MG tablet Take 1 tablet (500 mg total) by mouth 2 (two) times daily. (Patient not taking: Reported on 09/12/2016) 20 tablet 0  . naproxen (NAPROSYN) 500 MG tablet Take 1 tablet (500 mg total) by mouth 2 (two) times daily. (Patient not taking: Reported on 09/12/2016) 30 tablet 0  . phenazopyridine (PYRIDIUM) 200 MG tablet Take 1 tablet (200 mg total) by mouth 3 (three) times daily. (Patient not taking: Reported on 09/12/2016) 6 tablet 0   No facility-administered medications prior to visit.     PAST MEDICAL HISTORY: Past Medical History:  Diagnosis Date  . Keratoconus of both eyes 2016  . Migraines     PAST SURGICAL HISTORY: Past Surgical History:  Procedure Laterality Date  . ABDOMINAL HYSTERECTOMY    . CHOLECYSTECTOMY    . HERNIA REPAIR     2012 and July 2015  . UTERINE FIBROID EMBOLIZATION      FAMILY HISTORY: Family History  Problem Relation Age of Onset  . Healthy Mother   . Diabetes Maternal Grandmother     SOCIAL HISTORY:  Social History   Social History  . Marital status: Single    Spouse name: N/A  . Number of children: 2  . Years of education: AS   Occupational History  . Not on file.   Social History Main Topics  . Smoking status: Never Smoker  . Smokeless tobacco: Never Used  . Alcohol use No  . Drug use: No  . Sexual activity: Yes   Other Topics Concern  . Not on file   Social History Narrative   Patient is single.   Patient works full time .   Education college.   Right handed.   Caffeine none              PHYSICAL EXAM  Vitals:   09/12/16 1129  BP: 106/63  Pulse: 90  Weight: 182 lb 9.6 oz (82.8 kg)  Height: 5\' 5"  (1.651 m)    Body mass index is 30.39 kg/m.  No exam data present  No flowsheet data found.  GENERAL EXAM: Patient is in no distress; well developed, nourished and groomed; neck  is supple  CARDIOVASCULAR: Regular rate and rhythm, no murmurs, no carotid bruits  NEUROLOGIC: MENTAL STATUS: awake, alert, language fluent, comprehension intact, naming intact, fund of knowledge appropriate CRANIAL NERVE: pupils equal and reactive to light, visual fields full to confrontation, extraocular muscles intact, no nystagmus, facial sensation and strength symmetric, hearing intact, palate elevates symmetrically, uvula midline, shoulder shrug symmetric, tongue midline. MOTOR: normal bulk and tone, full strength in the BUE, BLE SENSORY: normal and symmetric to light touch, pinprick, temperature, vibration COORDINATION: finger-nose-finger, fine finger movements  REFLEXES: deep tendon reflexes present and symmetric GAIT/STATION: narrow based gait; romberg is negative    DIAGNOSTIC DATA (LABS, IMAGING, TESTING) - I reviewed patient records, labs, notes, testing and imaging myself where available.  Lab Results  Component Value Date   WBC 5.1 06/14/2016   HGB 12.1 06/14/2016   HCT 36.6 06/14/2016   MCV 85.7 06/14/2016   PLT 277 06/14/2016      Component Value Date/Time   NA 137 06/14/2016 2050  K 3.1 (L) 06/14/2016 2050   CL 106 06/14/2016 2050   CO2 23 06/14/2016 2050   GLUCOSE 112 (H) 06/14/2016 2050   BUN 6 06/14/2016 2050   CREATININE 0.67 06/14/2016 2050   CALCIUM 8.6 (L) 06/14/2016 2050   PROT 7.2 02/07/2016 2210   ALBUMIN 4.0 02/07/2016 2210   AST 14 (L) 02/07/2016 2210   ALT 12 (L) 02/07/2016 2210   ALKPHOS 51 02/07/2016 2210   BILITOT 0.3 02/07/2016 2210   GFRNONAA >60 06/14/2016 2050   GFRAA >60 06/14/2016 2050   No results found for: CHOL, HDL, LDLCALC, LDLDIRECT, TRIG, CHOLHDL No results found for: HGBA1C No results found for: VITAMINB12 No results found for: TSH   I reviewed images myself and agree with interpretation. -VRP  12/16/13 CT head - normal  05/24/14 MRI brain - normal     ASSESSMENT AND PLAN  36 y.o. year old female here with  headaches since age 69 years old, worsening since fall/winter 2015, then better in 2017, now worse in 2018. Previously did well on amitriptyline. Now having some weight gain issues.  Dx:  Migraine without aura and without status migrainosus, not intractable  Sleep apnea, unspecified type  BMI 30.0-30.9,adult     PLAN:  - start topiramate 50mg  at bedtime x 1-2 weeks, then increase to twice a day; drink plenty of water  - OTC rescue meds (excedrin, tylenol, ibuprofen)  - check sleep study (witness apnea, snoring, insomnia, excessive daytime sleepiness, BMI 30)  Meds ordered this encounter  Medications  . topiramate (TOPAMAX) 50 MG tablet    Sig: Take 1 tablet (50 mg total) by mouth at bedtime.    Dispense:  60 tablet    Refill:  6   Return in about 3 months (around 12/12/2016).    Penni Bombard, MD 5/36/6440, 34:74 PM Certified in Neurology, Neurophysiology and Neuroimaging  [facility] [address], [place], [place] [address] [phone]

## 2016-09-12 NOTE — Patient Instructions (Signed)
-   start topiramate 50mg  at bedtime x 1-2 weeks, then increase to twice a day; drink plenty of water  - OTC rescue meds (excedrin, tylenol, ibuprofen)  - check sleep study

## 2016-12-12 ENCOUNTER — Ambulatory Visit (INDEPENDENT_AMBULATORY_CARE_PROVIDER_SITE_OTHER): Payer: Medicaid Other | Admitting: Diagnostic Neuroimaging

## 2016-12-12 ENCOUNTER — Encounter: Payer: Self-pay | Admitting: Diagnostic Neuroimaging

## 2016-12-12 VITALS — BP 101/65 | HR 71 | Ht 65.0 in | Wt 181.8 lb

## 2016-12-12 DIAGNOSIS — Z6830 Body mass index (BMI) 30.0-30.9, adult: Secondary | ICD-10-CM

## 2016-12-12 DIAGNOSIS — R0681 Apnea, not elsewhere classified: Secondary | ICD-10-CM

## 2016-12-12 DIAGNOSIS — G43009 Migraine without aura, not intractable, without status migrainosus: Secondary | ICD-10-CM

## 2016-12-12 DIAGNOSIS — H539 Unspecified visual disturbance: Secondary | ICD-10-CM | POA: Diagnosis not present

## 2016-12-12 MED ORDER — AMITRIPTYLINE HCL 10 MG PO TABS: 10.0000 mg | ORAL_TABLET | Freq: Every day | ORAL | 6 refills | Status: DC

## 2016-12-12 NOTE — Progress Notes (Signed)
[facility]  PATIENT: Mackenzie [name] DOB: 12-Oct-1980  PRIMARY CARE CLINICIAN: Jac Canavan, MD HISTORY FROM: patient  REASON FOR VISIT: follow up   HISTORICAL  CHIEF COMPLAINT:  Chief Complaint  Patient presents with  . Follow-up  . Migraine    topamax did not help her migraines.     HISTORY OF PRESENT ILLNESS:   UPDATE 12/12/16: Since last visit, tried TPX (until July 2018) without benefit. Now having almost daily HA. Taking daily OTC meds. Vision is getting worse.   UPDATE 09/12/16 (VRP): Since last visit, was doing well. Had daith (Apr 2017) piercing and had excellent results, then developed keloid and had piercing removed (Feb 2018). Now HA are returning. Now daily lower level headaches severe migraine 4x / week. Also pt fiance has noted that pt having some restless during sleep, apnea, gasping during last 6 months. Pt has daytime sleepiness. Has had 20lb weight gain in last 6 months.   UPDATE 01/21/15 (MM): "36 year old female with a history of migraine headaches. She returns today for follow-up. The patient is currently taking amitriptyline 25 mg. She states that she only takes this medication when she has a headache. She approximally takes it 2-3 times a week. She is unable to tolerate daily due to next day fatigue. She states that she has approximate 6 dull headaches a week and 2-3 severe headaches a week. Her headaches normally occur in the temporal region bilaterally. She does have photophobia but denies phonophobia. Denies nausea or vomiting. The patient expressed that she would like to try a different medication if possible. She is open to trying a daily preventative medication as long as it does not cause fatigue. She returns today for an evaluation."  UPDATE 07/02/14: Since last visit, on amitriptyline 25mg  every other night (~3x per week) and doing better. Cannot tolerate daily amitriptyline due to next day fatigue. Now having 1-2 HA per week. Previously having  daily severe HA. Satisfied with amitriptyline.   PRIOR HPI (05/09/14): 36 year old right-handed female here for evaluation of headaches. Headache started age 3 years old with mild intermittent headaches. Over time they gradually worsened. Headaches significant worsened past few months and has been constant for past 2 weeks. Now she describes bitemporal throbbing severe headaches with photophobia. No phonophobia and nausea or vomiting. Chewing and eating seems to aggravate her pain. Patient has lost 13 pounds over past few weeks due to inability to eat. Patient has tried Tylenol, Motrin, Excedrin, without relief. She was prescribed Fioricet without relief. Patient has got to the emergency room several times for headache evaluation. No specific triggering factors for her headaches. Headaches are leading to insomnia. Patient also has diagnosis of keratosis, previously evaluated by ophthalmology in [place]. Patient moved to [facility] 1-1/2 years ago. She struggling to get established with PCP and ophthalmology here. Patient not able to work due to blurred vision.   REVIEW OF SYSTEMS: Full 14 system review of systems performed and negative except: insomnia apnea dizziness headache excess eating eye pain blurred vision excess sweating excess eating.    ALLERGIES: No Known Allergies  HOME MEDICATIONS: Outpatient Medications Prior to Visit  Medication Sig Dispense Refill  . topiramate (TOPAMAX) 50 MG tablet Take 1 tablet (50 mg total) by mouth at bedtime. (Patient not taking: Reported on 12/12/2016) 60 tablet 6   No facility-administered medications prior to visit.     PAST MEDICAL HISTORY: Past Medical History:  Diagnosis Date  . Keratoconus of both eyes 2016  . Migraines  PAST SURGICAL HISTORY: Past Surgical History:  Procedure Laterality Date  . ABDOMINAL HYSTERECTOMY    . CHOLECYSTECTOMY    . HERNIA REPAIR     2012 and July 2015  . UTERINE FIBROID EMBOLIZATION      FAMILY  HISTORY: Family History  Problem Relation Age of Onset  . Healthy Mother   . Diabetes Maternal Grandmother     SOCIAL HISTORY:  Social History   Social History  . Marital status: Single    Spouse name: N/A  . Number of children: 2  . Years of education: AS   Occupational History  . Not on file.   Social History Main Topics  . Smoking status: Never Smoker  . Smokeless tobacco: Never Used  . Alcohol use No  . Drug use: No  . Sexual activity: Yes   Other Topics Concern  . Not on file   Social History Narrative   Patient is single.   Patient works full time .   Education college.   Right handed.   Caffeine none              PHYSICAL EXAM  Vitals:   12/12/16 1048  BP: 101/65  Pulse: 71  Weight: 181 lb 12.8 oz (82.5 kg)  Height: 5\' 5"  (1.651 m)    Body mass index is 30.25 kg/m.   Visual Acuity Screening   Right eye Left eye Both eyes  Without correction: 20/100 20/200   With correction:       No flowsheet data found.  GENERAL EXAM: Patient is in no distress; well developed, nourished and groomed; neck is supple  CARDIOVASCULAR: Regular rate and rhythm, no murmurs, no carotid bruits  NEUROLOGIC: MENTAL STATUS: awake, alert, language fluent, comprehension intact, naming intact, fund of knowledge appropriate CRANIAL NERVE: CORNEAL PIGMENTATION NOTED BILATERALLY; CANNOT VISUALIZE FUNDI; pupils equal and reactive to light, visual fields full to confrontation, extraocular muscles intact, no nystagmus, facial sensation and strength symmetric, hearing intact, palate elevates symmetrically, uvula midline, shoulder shrug symmetric, tongue midline. MOTOR: normal bulk and tone, full strength in the BUE, BLE SENSORY: normal and symmetric to light touch, temperature, vibration COORDINATION: finger-nose-finger, fine finger movements  REFLEXES: deep tendon reflexes present and symmetric GAIT/STATION: narrow based gait; romberg is negative    DIAGNOSTIC DATA  (LABS, IMAGING, TESTING) - I reviewed patient records, labs, notes, testing and imaging myself where available.  Lab Results  Component Value Date   WBC 5.1 06/14/2016   HGB 12.1 06/14/2016   HCT 36.6 06/14/2016   MCV 85.7 06/14/2016   PLT 277 06/14/2016      Component Value Date/Time   NA 137 06/14/2016 2050   K 3.1 (L) 06/14/2016 2050   CL 106 06/14/2016 2050   CO2 23 06/14/2016 2050   GLUCOSE 112 (H) 06/14/2016 2050   BUN 6 06/14/2016 2050   CREATININE 0.67 06/14/2016 2050   CALCIUM 8.6 (L) 06/14/2016 2050   PROT 7.2 02/07/2016 2210   ALBUMIN 4.0 02/07/2016 2210   AST 14 (L) 02/07/2016 2210   ALT 12 (L) 02/07/2016 2210   ALKPHOS 51 02/07/2016 2210   BILITOT 0.3 02/07/2016 2210   GFRNONAA >60 06/14/2016 2050   GFRAA >60 06/14/2016 2050   No results found for: CHOL, HDL, LDLCALC, LDLDIRECT, TRIG, CHOLHDL No results found for: HGBA1C No results found for: VITAMINB12 No results found for: TSH   12/16/13 CT head - normal  05/24/14 MRI brain - normal     ASSESSMENT AND PLAN  36  y.o. year old female here with headaches since age 55 years old, worsening since fall/winter 2015, then better in 2017, now worse in 2018. Previously did well on amitriptyline. Now having some weight gain issues.  Meds tried: amitripyline (helped, but caused sleepiness and weight gain), topiramate (not effective), aleve (mild benefit), excedrin (mild benefit), rizatriptan (not effective)   Dx:  Migraine without aura and without status migrainosus, not intractable - Plan: MR BRAIN W WO CONTRAST  BMI 30.0-30.9,adult - Plan: Ambulatory referral to Sleep Studies  Apnea - Plan: Ambulatory referral to Sleep Studies  Vision changes - Plan: MR BRAIN W WO CONTRAST, Ambulatory referral to Sleep Studies    PLAN:  I spent 25 minutes of face to face time with patient. Greater than 50% of time was spent in counseling and coordination of care with patient. In summary we discussed:   INCREASING  HEADACHES/BLURRED VISION - check MRI brain; if negative, then will check LP to measure opening pressure (need to rule out pseudotumor cerebri) - follow up with ophthalmology (re: keratoconus and blurred vision)  MIGRAINE TREATMENT - start amitriptyline 10mg  at bedtime (lower dose than previously) - OTC rescue meds (excedrin, tylenol, ibuprofen) --> limit to 5-10 doses per month  SNORING, APNEA - check sleep study (witness apnea, snoring, insomnia, excessive daytime sleepiness, BMI 30)   Meds ordered this encounter  Medications  . amitriptyline (ELAVIL) 10 MG tablet    Sig: Take 1 tablet (10 mg total) by mouth at bedtime.    Dispense:  30 tablet    Refill:  6   Orders Placed This Encounter  Procedures  . MR BRAIN W WO CONTRAST  . Ambulatory referral to Sleep Studies   Return in about 6 months (around 06/14/2017).    Penni Bombard, MD 8/34/1962, 22:97 AM Certified in Neurology, Neurophysiology and Neuroimaging  [facility] [address] [place], [place] [address] [phone]

## 2016-12-12 NOTE — Patient Instructions (Signed)
  INCREASING HEADACHES/BLURRED VISION - check MRI brain; if negative, then will check LP to measure opening pressure (need to rule out pseudotumor cerebri) - follow up with ophthalmology (re: keratoconus and blurred vision)  MIGRAINE TREATMENT - start amitriptyline 10mg  at bedtime (lower dose than previously) - OTC rescue meds (excedrin, tylenol, ibuprofen) --> limit to 5-10 doses per month  SNORING, APNEA - check sleep study (witness apnea, snoring, insomnia, excessive daytime sleepiness, BMI 30)

## 2017-01-11 ENCOUNTER — Ambulatory Visit (INDEPENDENT_AMBULATORY_CARE_PROVIDER_SITE_OTHER): Payer: Medicaid Other | Admitting: Neurology

## 2017-01-11 ENCOUNTER — Encounter: Payer: Self-pay | Admitting: Neurology

## 2017-01-11 VITALS — BP 114/78 | HR 70 | Wt 187.0 lb

## 2017-01-11 DIAGNOSIS — R635 Abnormal weight gain: Secondary | ICD-10-CM

## 2017-01-11 DIAGNOSIS — R0681 Apnea, not elsewhere classified: Secondary | ICD-10-CM | POA: Diagnosis not present

## 2017-01-11 DIAGNOSIS — R0683 Snoring: Secondary | ICD-10-CM

## 2017-01-11 DIAGNOSIS — R351 Nocturia: Secondary | ICD-10-CM

## 2017-01-11 DIAGNOSIS — R51 Headache: Secondary | ICD-10-CM | POA: Diagnosis not present

## 2017-01-11 DIAGNOSIS — R519 Headache, unspecified: Secondary | ICD-10-CM

## 2017-01-11 DIAGNOSIS — G2581 Restless legs syndrome: Secondary | ICD-10-CM | POA: Diagnosis not present

## 2017-01-11 DIAGNOSIS — E669 Obesity, unspecified: Secondary | ICD-10-CM

## 2017-01-11 NOTE — Progress Notes (Signed)
Subjective:    Mackenzie [name] ID: Mackenzie Mackenzie [name] is a 36 y.o. female.  HPI     Mackenzie Age, MD, [name] [facility] [address] [place], [place] [address]  Dear Mackenzie Mackenzie [name],   I saw your Mackenzie [name], Mackenzie Mackenzie [name], upon your kind request in my clinic today for initial consultation of her sleep disorder, in particular, concern for underlying obstructive sleep apnea. Mackenzie Mackenzie [name] is unaccompanied today. As you know, Mackenzie Mackenzie [name] is a 36 year old right-handed woman with an underlying medical history of migraine headaches and obesity, who reports snoring and excessive daytime somnolence. I reviewed your office note from 12/12/2016. Her Epworth sleepiness score is 0 out of 24, fatigue score is 46 out of 63. She takes amitriptyline as needed and stop taking Topamax in July. She lives with her children, she has 2 children. She is a nonsmoker and does not drink alcohol or use illicit drugs and does not utilize caffeine on a regular basis. She moved from [place] about 5 years ago. She has gained weight. She has a more sedentary lifestyle here. She works in a daycare. She has a 80 year old son who is in college and a 72 year old daughter who lives with her. Her boyfriend has noticed loud snoring and witnessed apneas while she is asleep. She has woken herself up with a sense of gasping at times. She has no family history of OSA. She moved here primarily to take care of her grandmother who passed away last year. She has intermittent restless leg symptoms. She had a hysterectomy and sometimes feels really hot at night, sometimes cold. She is restless at night. Bedtime is around 8 and wake up time around 6. She has nocturia about 2 or 3 times per average night and has woken up with headaches. She feels sedated after taking amitriptyline even at low dose of 10 mg and therefore takes it infrequently but it does help her headaches. Topamax did not help. She is scheduled for an MRI this week.  Her  Past Medical History Is Significant For: Past Medical History:  Diagnosis Date  . Keratoconus of both eyes 2016  . Migraines     Her Past Surgical History Is Significant For: Past Surgical History:  Procedure Laterality Date  . ABDOMINAL HYSTERECTOMY    . ANKLE SURGERY     2 screws in right ankle  . CHOLECYSTECTOMY    . HERNIA REPAIR     2012 and July 2015  . UTERINE FIBROID EMBOLIZATION      Her Family History Is Significant For: Family History  Problem Relation [name] of Onset  . Healthy Mother   . Diabetes Maternal Grandmother     Her Social History Is Significant For: Social History   Social History  . Marital status: Single    Spouse name: N/A  . Number of children: 2  . Years of education: AS   Social History Main Topics  . Smoking status: Never Smoker  . Smokeless tobacco: Never Used  . Alcohol use No  . Drug use: No  . Sexual activity: Yes   Other Topics Concern  . None   Social History Narrative   Mackenzie [name].   Mackenzie [name] .   Education college.   Right handed.   Caffeine none             Her Allergies Are:  No Known Allergies:   Her Current Medications Are:  Outpatient Encounter Prescriptions as of 01/11/2017  Medication  Sig  . amitriptyline (ELAVIL) 10 MG tablet Take 1 tablet (10 mg total) by mouth at bedtime.  . topiramate (TOPAMAX) 50 MG tablet Take 1 tablet (50 mg total) by mouth at bedtime. (Mackenzie [name] not taking: Reported on 12/12/2016)   No facility-administered encounter medications on file as of 01/11/2017.   :  Review of Systems:  Out of a complete 14 point review of systems, all are reviewed and negative with Mackenzie exception of these symptoms as listed below: Review of Systems  Constitutional: Positive for appetite change, fatigue and unexpected weight change.  Eyes: Positive for pain.       Blurred vision  Respiratory:       Snoring  Gastrointestinal: Positive for constipation.  Endocrine: Positive for  polydipsia.       Feeling hot, feeling cold  Neurological:       New Mackenzie [name] evaluation for apnea and blurred vision.  Epworth Sleepiness Scale 0= would never doze 1= slight chance of dozing 2= moderate chance of dozing 3= high chance of dozing  Sitting and reading:0 Watching TV: 0 Sitting inactive in a public place (ex. Theater or meeting):0 As a passenger in a car for an hour without a break:0 Lying down to rest in Mackenzie afternoon:0 Sitting and talking to someone:0 Sitting quietly after lunch (no alcohol):0 In a car, while stopped in traffic:0 Total:0  Not enough sleep  Psychiatric/Behavioral:       Decreased energy    Objective:  Neurological Exam  Physical Exam Physical Examination:   Vitals:   01/11/17 1523  BP: 114/78  Pulse: 70    General Examination: Mackenzie Mackenzie [name] is a very pleasant 36 y.o. female in no acute distress. She appears well-developed and well-nourished and well groomed.   HEENT: Normocephalic, atraumatic, pupils are equal, round and reactive to light and accommodation. Extraocular tracking is good without limitation to gaze excursion or nystagmus noted. Normal smooth pursuit is noted. Hearing is grossly intact. Tympanic membranes are clear bilaterally. Face is symmetric with normal facial animation and normal facial sensation. Speech is clear with no dysarthria noted. There is no hypophonia. There is no lip, neck/head, jaw or voice tremor. Neck is supple with full range of passive and active motion. There are no carotid bruits on auscultation. Oropharynx exam reveals: mild mouth dryness, good dental hygiene and moderate airway crowding, due to Smaller airway entry, tonsils 2-3+, uvula is small. Tongue on Mackenzie larger side. Mallampati is class I. Tongue protrudes centrally and palate elevates symmetrically. Neck circumference is 14-1/2 inches. She has a minimal overbite.  Chest: Clear to auscultation without wheezing, rhonchi or crackles noted.  Heart:  S1+S2+0, regular and normal without murmurs, rubs or gallops noted.   Abdomen: Soft, non-tender and non-distended with normal bowel sounds appreciated on auscultation.  Extremities: There is no pitting edema in Mackenzie distal lower extremities bilaterally. Pedal pulses are intact.  Skin: Warm and dry without trophic changes noted. There are no varicose veins.  Musculoskeletal: exam reveals no obvious joint deformities, tenderness or joint swelling or erythema.   Neurologically:  Mental status: Mackenzie Mackenzie [name] is awake, alert and oriented in all 4 spheres. Her immediate and remote memory, attention, language skills and fund of knowledge are appropriate. There is no evidence of aphasia, agnosia, apraxia or anomia. Speech is clear with normal prosody and enunciation. Thought process is linear. Mood is normal and affect is normal.  Cranial nerves II - XII are as described above under HEENT exam. In addition: shoulder shrug is normal  with equal shoulder height noted. Motor exam: Normal bulk, strength and tone is noted. There is no drift, tremor or rebound. Romberg is negative. Reflexes are 2+ throughout. Fine motor skills and coordination: intact grossly. No intention tremor or dysmetria. Sensory exam is intact to light touch.  Gait, station and balance: She stands easily. No veering to one side is noted. No leaning to one side is noted. Posture is [name]-appropriate and stance is narrow based. Gait shows normal stride length and normal pace. No problems turning are noted.   Assessment and Plan:    In summary, Astra Gregg is a very pleasant 36 y.o.-year old female with an underlying medical history of migraine headaches and obesity, whose history and physical exam are concerning for obstructive sleep apnea (OSA). In addition, she endorses intermittent restless leg symptoms. I had a long chat with Mackenzie Mackenzie [name] about my findings and Mackenzie diagnosis of OSA, its prognosis and treatment options. We talked about medical  treatments, surgical interventions and non-pharmacological approaches. I explained in particular Mackenzie risks and ramifications of untreated moderate to severe OSA, especially with respect to developing cardiovascular disease down Mackenzie [address], including congestive heart failure, difficult to treat hypertension, cardiac arrhythmias, or stroke. Even type 2 diabetes has, in part, been linked to untreated OSA. Symptoms of untreated OSA include daytime sleepiness, memory problems, mood irritability and mood disorder such as depression and anxiety, lack of energy, as well as recurrent headaches, especially morning headaches. We talked about trying to maintain a healthy lifestyle in general, as well as Mackenzie importance of weight control. I encouraged Mackenzie Mackenzie [name] to eat healthy, exercise daily and keep well hydrated, to keep a scheduled bedtime and wake time routine, to not skip any meals and eat healthy snacks in between meals. I advised Mackenzie Mackenzie [name] not to drive when feeling sleepy. I recommended Mackenzie following at this time: sleep study with potential positive airway pressure titration. (We will score hypopneas at 4%).   I explained Mackenzie sleep test procedure to Mackenzie Mackenzie [name] and also outlined possible surgical and non-surgical treatment options of OSA, including Mackenzie use of a custom-made dental device (which would require a referral to a specialist dentist or oral surgeon), upper airway surgical options, such as pillar implants, radiofrequency surgery, tongue base surgery, and UPPP (which would involve a referral to an ENT surgeon). Rarely, jaw surgery such as mandibular advancement may be considered.  I also explained Mackenzie CPAP treatment option to Mackenzie Mackenzie [name], who indicated that she would be willing to try CPAP if Mackenzie need arises. I explained Mackenzie importance of being compliant with PAP treatment, not only for insurance purposes but primarily to improve Her symptoms, and for Mackenzie Mackenzie [name]'s long term health benefit, including to reduce  Her cardiovascular risks. I answered all her questions today and Mackenzie Mackenzie [name] was in agreement. I would like to see her back after Mackenzie sleep study is completed and encouraged her to call with any interim questions, concerns, problems or updates.   Thank you very much for allowing me to participate in Mackenzie care of this nice Mackenzie [name]. If I can be of any further assistance to you please do not hesitate to talk to me.  Sincerely,   Mackenzie Age, MD, [name]

## 2017-01-11 NOTE — Patient Instructions (Addendum)
Based on your symptoms and your exam I believe you are at risk for obstructive sleep apnea or OSA, and I think we should proceed with a sleep study to determine whether you do or do not have OSA and how severe it is. If you have more than mild OSA, I want you to consider treatment with CPAP. Please remember, the risks and ramifications of moderate to severe obstructive sleep apnea or OSA are: Cardiovascular disease, including congestive heart failure, stroke, difficult to control hypertension, arrhythmias, and even type 2 diabetes has been linked to untreated OSA. Sleep apnea causes disruption of sleep and sleep deprivation in most cases, which, in turn, can cause recurrent headaches, problems with memory, mood, concentration, focus, and vigilance. Most people with untreated sleep apnea report excessive daytime sleepiness, which can affect their ability to drive. Please do not drive if you feel sleepy.   I will likely see you back after your sleep study to go over the test results and where to go from there. We will call you after your sleep study to advise about the results (most likely, you will hear from Diana, my nurse) and to set up an appointment at the time, as necessary.    Our sleep lab administrative assistant, Dawn will meet with you or call you to schedule your sleep study. If you don't hear back from her by about 2 weeks from now, please feel free to call her at [phone]. This is her direct line and please leave a message with your phone number to call back if you get the voicemail box. She will call back as soon as possible.   

## 2017-01-14 ENCOUNTER — Ambulatory Visit
Admission: RE | Admit: 2017-01-14 | Discharge: 2017-01-14 | Disposition: A | Discharge status: Home / Self Care | Payer: Medicaid Other | Source: Ambulatory Visit | Attending: Diagnostic Neuroimaging | Admitting: Diagnostic Neuroimaging

## 2017-01-14 DIAGNOSIS — H539 Unspecified visual disturbance: Secondary | ICD-10-CM | POA: Diagnosis not present

## 2017-01-14 DIAGNOSIS — G43009 Migraine without aura, not intractable, without status migrainosus: Secondary | ICD-10-CM

## 2017-01-14 MED ORDER — GADOBENATE DIMEGLUMINE 529 MG/ML IV SOLN
12.0000 mL | Freq: Once | INTRAVENOUS | Status: AC | PRN
  Administered 2017-01-14: 12 mL via INTRAVENOUS

## 2017-01-16 ENCOUNTER — Telehealth: Payer: Self-pay | Admitting: *Deleted

## 2017-01-16 NOTE — Telephone Encounter (Signed)
Spoke with patient and informed her that her MRI brain showed overall unremarkable imaging results. Advised her the brain is normal. Incidentally noted are some mild sinus inflammation, advised if she is having symptoms she can contact her PCP. She has had her sleep study evaluation. She had no questions, verbalized understanding, appreciation.

## 2017-02-10 ENCOUNTER — Ambulatory Visit (INDEPENDENT_AMBULATORY_CARE_PROVIDER_SITE_OTHER): Payer: Medicaid Other | Admitting: Neurology

## 2017-02-10 DIAGNOSIS — G4730 Sleep apnea, unspecified: Secondary | ICD-10-CM | POA: Diagnosis not present

## 2017-02-10 DIAGNOSIS — R51 Headache: Secondary | ICD-10-CM

## 2017-02-10 DIAGNOSIS — E669 Obesity, unspecified: Secondary | ICD-10-CM

## 2017-02-10 DIAGNOSIS — R519 Headache, unspecified: Secondary | ICD-10-CM

## 2017-02-10 DIAGNOSIS — R635 Abnormal weight gain: Secondary | ICD-10-CM

## 2017-02-10 DIAGNOSIS — G2581 Restless legs syndrome: Secondary | ICD-10-CM

## 2017-02-10 DIAGNOSIS — G4720 Circadian rhythm sleep disorder, unspecified type: Secondary | ICD-10-CM

## 2017-02-10 DIAGNOSIS — R0681 Apnea, not elsewhere classified: Secondary | ICD-10-CM

## 2017-02-10 DIAGNOSIS — R0683 Snoring: Secondary | ICD-10-CM

## 2017-02-10 DIAGNOSIS — R351 Nocturia: Secondary | ICD-10-CM

## 2017-02-12 ENCOUNTER — Encounter (HOSPITAL_COMMUNITY): Payer: Self-pay | Admitting: *Deleted

## 2017-02-12 ENCOUNTER — Emergency Department (HOSPITAL_COMMUNITY): Payer: Medicaid Other

## 2017-02-12 DIAGNOSIS — S93401A Sprain of unspecified ligament of right ankle, initial encounter: Secondary | ICD-10-CM | POA: Diagnosis not present

## 2017-02-12 DIAGNOSIS — Y999 Unspecified external cause status: Secondary | ICD-10-CM | POA: Diagnosis not present

## 2017-02-12 DIAGNOSIS — Y929 Unspecified place or not applicable: Secondary | ICD-10-CM | POA: Insufficient documentation

## 2017-02-12 DIAGNOSIS — Z79899 Other long term (current) drug therapy: Secondary | ICD-10-CM | POA: Diagnosis not present

## 2017-02-12 DIAGNOSIS — Y939 Activity, unspecified: Secondary | ICD-10-CM | POA: Diagnosis not present

## 2017-02-12 DIAGNOSIS — W172XXA Fall into hole, initial encounter: Secondary | ICD-10-CM | POA: Diagnosis not present

## 2017-02-12 DIAGNOSIS — S99911A Unspecified injury of right ankle, initial encounter: Secondary | ICD-10-CM | POA: Diagnosis present

## 2017-02-12 NOTE — ED Triage Notes (Signed)
Stepped in hole this afternoon, swelling to rt ankle and top of foot, pt has screws in ankle that is injured.

## 2017-02-13 ENCOUNTER — Emergency Department (HOSPITAL_COMMUNITY)
Admission: EM | Admit: 2017-02-13 | Discharge: 2017-02-13 | Disposition: A | Discharge status: Home / Self Care | Payer: Medicaid Other | Attending: Emergency Medicine | Admitting: Emergency Medicine

## 2017-02-13 DIAGNOSIS — S93401A Sprain of unspecified ligament of right ankle, initial encounter: Secondary | ICD-10-CM

## 2017-02-13 MED ORDER — IBUPROFEN 200 MG PO TABS
600.0000 mg | ORAL_TABLET | Freq: Once | ORAL | Status: AC
  Administered 2017-02-13: 600 mg via ORAL
  Filled 2017-02-13: qty 3

## 2017-02-13 MED ORDER — IBUPROFEN 600 MG PO TABS: 600.0000 mg | ORAL_TABLET | Freq: Four times a day (QID) | ORAL | 0 refills | Status: DC | PRN

## 2017-02-13 NOTE — Progress Notes (Signed)
Patient referred by Dr. Leta Baptist, seen by me on 01/11/17, diagnostic PSG on 02/10/17.   Please call and notify the patient that the recent sleep study did not show any significant obstructive sleep apnea or leg movements, mild intermittent snoring. For disturbing snoring, an oral appliance (through a qualified dentist) can be considered.  Please remind patient to try to maintain good sleep hygiene, which means: Keep a regular sleep and wake schedule and make enough time for sleep (7 1/2 to 8 1/2 hours for the average adult), try not to exercise or have a meal within 2 hours of your bedtime, try to keep your bedroom conducive for sleep, that is, cool and dark, without light distractors such as an illuminated alarm clock, and refrain from watching TV right before sleep or in the middle of the night and do not keep the TV or radio on during the night. If a nightlight is used, have it away from the visual field. Also, try not to use or play on electronic devices at bedtime, such as your cell phone, tablet PC or laptop. If you like to read at bedtime on an electronic device, try to dim the background light as much as possible. Do not eat in the middle of the night. Keep pets away from the bedroom environment. For stress relief, try meditation, deep breathing exercises (there are many books and CDs available), a white noise machine or fan can help to diffuse other noise distractors, such as traffic noise. Do not drink alcohol before bedtime, as it can disturb sleep and cause middle of the night awakenings. Never mix alcohol and sedating medications! Avoid narcotic pain medication close to bedtime, as opioids/narcotics can suppress breathing drive and breathing effort.   Patient can FU with Dr. Leta Baptist as planned. Once you have spoken to patient, you can close this encounter.   Thanks,  [name], MD, [name] [facility])

## 2017-02-13 NOTE — ED Provider Notes (Signed)
[facility] DEPT Provider Note   CSN: [id number] Arrival date & time: 02/12/17  2033     History   Chief Complaint Chief Complaint  Patient presents with  . Ankle Pain    Rt    HPI Mackenzie [name] is a 36 y.o. female.  Patient presents with right ankle pain and swelling after inversion injury caused by stepping in a hole yesterday (02/12/17). She had previous surgery to that ankle in [place] remotely. No other injury.    The history is provided by the patient. No language interpreter was used.  Ankle Pain   Pertinent negatives include no numbness.    Past Medical History:  Diagnosis Date  . Keratoconus of both eyes 2016  . Migraines     Patient Active Problem List   Diagnosis Date Noted  . Migraines   . Migraine without aura and without status migrainosus, not intractable 07/02/2014  . Blurred vision 05/09/2014  . Keratosis 05/09/2014    Past Surgical History:  Procedure Laterality Date  . ABDOMINAL HYSTERECTOMY    . ANKLE SURGERY     2 screws in right ankle  . CHOLECYSTECTOMY    . HERNIA REPAIR     2012 and July 2015  . UTERINE FIBROID EMBOLIZATION      OB History    No data available       Home Medications    Prior to Admission medications   Medication Sig Start Date End Date Taking? Authorizing Provider  amitriptyline (ELAVIL) 10 MG tablet Take 1 tablet (10 mg total) by mouth at bedtime. 12/12/16   Penumalli, Earlean Polka, MD  topiramate (TOPAMAX) 50 MG tablet Take 1 tablet (50 mg total) by mouth at bedtime. Patient not taking: Reported on 12/12/2016 09/12/16   Penni Bombard, MD    Family History Family History  Problem Relation Age of Onset  . Healthy Mother   . Diabetes Maternal Grandmother     Social History Social History  Substance Use Topics  . Smoking status: Never Smoker  . Smokeless tobacco: Never Used  . Alcohol use No     Allergies   Patient has no known allergies.   Review of Systems Review of Systems  Musculoskeletal:       See HPI.  Skin: Negative.  Negative for color change.  Neurological: Negative.  Negative for weakness and numbness.     Physical Exam Updated Vital Signs BP (!) 105/59 (BP Location: Left Arm)   Pulse 75   Temp 98.2 F (36.8 C) (Oral)   Resp 18   Ht 5\' 5"  (1.651 m)   Wt 81.2 kg (179 lb)   LMP 01/10/2012   SpO2 100%   BMI 29.79 kg/m   Physical Exam  Constitutional: She is oriented to person, place, and time. She appears well-developed and well-nourished.  Neck: Normal range of motion.  Cardiovascular: Intact distal pulses.   Pulmonary/Chest: Effort normal.  Musculoskeletal:  Right ankle swollen laterally. No deformity. Joint stable. No discoloration. Achilles intact.   Neurological: She is alert and oriented to person, place, and time.  Skin: Skin is warm and dry.     ED Treatments / Results  Labs (all labs ordered are listed, but only abnormal results are displayed) Labs Reviewed - No data to display  EKG  EKG Interpretation None       Radiology Dg Ankle Complete Right  Result Date: 02/12/2017 CLINICAL DATA:  Stepped in ankle, lateral pain and swelling. EXAM: RIGHT ANKLE - COMPLETE 3+ VIEW  COMPARISON:  None. FINDINGS: No fracture deformity nor dislocation. Distal tibial intact screws, no residual fracture line. The ankle mortise appears congruent and the tibiofibular syndesmosis intact. No destructive bony lesions. Ankle soft tissue swelling without subcutaneous gas or radiopaque foreign bodies. IMPRESSION: Soft tissue swelling.  No acute osseous process. Electronically Signed   By: Elon Alas M.D.   On: 02/12/2017 23:09    Procedures Procedures (including critical care time)  Medications Ordered in ED Medications - No data to display   Initial Impression / Assessment and Plan / ED Course  I have reviewed the triage vital signs and the nursing notes.  Pertinent labs & imaging results that were available during my care of the patient were reviewed  by me and considered in my medical decision making (see chart for details).     Patient with ankle sprain injury, negative imaging. ASO/crutches provided. Will start on ibuprofen. Refer to ortho if no better in 4-5 days.   Final Clinical Impressions(s) / ED Diagnoses   Final diagnoses:  None   1. Right ankle sprain  New Prescriptions New Prescriptions   No medications on file     Charlann Lange, Hershal Coria 02/13/17 7622    Veryl Speak, MD 02/13/17 [phone]

## 2017-02-13 NOTE — Procedures (Signed)
PATIENT'S NAME:  Mackenzie [name], [name] DOB:      [birth year]      MR#:    [id number]     DATE OF RECORDING: 02/10/2017 REFERRING M.D.:  Andrey Spearman, MD Study Performed:   Baseline Polysomnogram HISTORY: 36 year old woman with a history of migraine headaches and obesity, who reports snoring and excessive daytime somnolence. The patient endorsed the Epworth Sleepiness Scale at 0 points. The patient's weight 187 pounds with a height of 65 (inches), resulting in a BMI of 31.2 kg/m2. The patient's neck circumference measured 14.5 inches.  CURRENT MEDICATIONS: Elavil, Topamax.    PROCEDURE:  This is a multichannel digital polysomnogram utilizing the Somnostar 11.2 system.  Electrodes and sensors were applied and monitored per AASM Specifications.   EEG, EOG, Chin and Limb EMG, were sampled at 200 Hz.  ECG, Snore and Nasal Pressure, Thermal Airflow, Respiratory Effort, CPAP Flow and Pressure, Oximetry was sampled at 50 Hz. Digital video and audio were recorded.      BASELINE STUDY  Lights Out was at 21:48 and Lights On at 05:18.  Total recording time (TRT) was 451 minutes, with a total sleep time (TST) of  437 minutes.   The patient's sleep latency was 6.5 minutes.  REM latency was 138 minutes, which is mildly delayed. The sleep efficiency was 96.9 %.     SLEEP ARCHITECTURE: WASO (Wake after sleep onset) was 10 minutes.  There were 8 minutes in Stage N1, 266 minutes Stage N2, 84 minutes Stage N3 and 79 minutes in Stage REM.  The percentage of Stage N1 was 1.8%, Stage N2 was 60.9%, which is mildly increased, Stage N3 was 19.2%, which is near-normal, and Stage R (REM sleep) was 18.1%, which is mildly reduced. The arousals were noted as: 51 were spontaneous, 0 were associated with PLMs, 2 were associated with respiratory events.    Audio and video analysis did not show any abnormal or unusual movements, behaviors, phonations or vocalizations. The patient took bathroom breaks. Mild intermittent snoring was noted.  The EKG was in keeping with normal sinus rhythm (NSR).  RESPIRATORY ANALYSIS:  There were a total of 9 respiratory events:  3 obstructive apneas, 1 central apneas and 0 mixed apneas with a total of 4 apneas and an apnea index (AI) of .5 /hour. There were 5 hypopneas with a hypopnea index of .7 /hour. The patient also had 1 respiratory event related arousals (RERAs).      The total APNEA/HYPOPNEA INDEX (AHI) was 1.2/hour and the total RESPIRATORY DISTURBANCE INDEX was 1.4 /hour.  4 events occurred in REM sleep and 8 events in NREM. The REM AHI was 3. /hour, versus a non-REM AHI of .8. The patient spent 228 minutes of total sleep time in the supine position and 209 minutes in non-supine.. The supine AHI was 1.9 versus a non-supine AHI of 0.6.  OXYGEN SATURATION & C02:  The Wake baseline 02 saturation was 96%, with the lowest being 87%. Time spent below 89% saturation equaled 1 minutes.  PERIODIC LIMB MOVEMENTS: The patient had a total of 0 Periodic Limb Movements.  The Periodic Limb Movement (PLM) index was 0 and the PLM Arousal index was 0/hour.  Post-study, the patient indicated that sleep was the same as usual.   IMPRESSION:  1. Primary Snoring 2. Dysfunctions associated with sleep stages or arousal from sleep  RECOMMENDATIONS:  1. This study does not demonstrate any significant obstructive or central sleep disordered breathing. This study does not support an intrinsic sleep disorder as  a cause of the patient's symptoms. Other causes, including circadian rhythm disturbances, an underlying mood disorder, medication effect and/or an underlying medical problem cannot be ruled out. 2. For disturbing snoring, an oral appliance (through a qualified dentist) can be considered.  3. The patient should be cautioned not to drive, work at heights, or operate dangerous or heavy equipment when tired or sleepy. Review and reiteration of good sleep hygiene measures should be pursued with any patient. 4. The  patient can follow-up with her referring provider, who will be notified of the test results.  I certify that I have reviewed the entire raw data recording prior to the issuance of this report in accordance with the Standards of Accreditation of the American Academy of Sleep Medicine (AASM)   [name], MD, [name] Diplomat, American Board of Psychiatry and Neurology (Neurology and Sleep Medicine)

## 2017-02-14 ENCOUNTER — Telehealth: Payer: Self-pay

## 2017-02-14 NOTE — Telephone Encounter (Signed)
-----   Message from [name], MD sent at 02/13/2017  7:54 AM EDT ----- Patient referred by Dr. Leta Baptist, seen by me on 01/11/17, diagnostic PSG on 02/10/17.   Please call and notify the patient that the recent sleep study did not show any significant obstructive sleep apnea or leg movements, mild intermittent snoring. For disturbing snoring, an oral appliance (through a qualified dentist) can be considered.  Please remind patient to try to maintain good sleep hygiene, which means: Keep a regular sleep and wake schedule and make enough time for sleep (7 1/2 to 8 1/2 hours for the average adult), try not to exercise or have a meal within 2 hours of your bedtime, try to keep your bedroom conducive for sleep, that is, cool and dark, without light distractors such as an illuminated alarm clock, and refrain from watching TV right before sleep or in the middle of the night and do not keep the TV or radio on during the night. If a nightlight is used, have it away from the visual field. Also, try not to use or play on electronic devices at bedtime, such as your cell phone, tablet PC or laptop. If you like to read at bedtime on an electronic device, try to dim the background light as much as possible. Do not eat in the middle of the night. Keep pets away from the bedroom environment. For stress relief, try meditation, deep breathing exercises (there are many books and CDs available), a white noise machine or fan can help to diffuse other noise distractors, such as traffic noise. Do not drink alcohol before bedtime, as it can disturb sleep and cause middle of the night awakenings. Never mix alcohol and sedating medications! Avoid narcotic pain medication close to bedtime, as opioids/narcotics can suppress breathing drive and breathing effort.   Patient can FU with Dr. Leta Baptist as planned. Once you have spoken to patient, you can close this encounter.   Thanks,  [name], MD, [name] [facility]  [facility])

## 2017-02-14 NOTE — Telephone Encounter (Signed)
I called pt. I advised pt that Dr. Rexene Alberts reviewed pt's sleep study and found that pt did not show any significant osa or leg movements, but did show mild intermittent sonring. Dr. Rexene Alberts recommends that pt speak with her dentist regarding an oral appliance for snoring treatment, if her snoring is disturbing. I reviewed sleep hygiene recommendations with the pt, including trying to keep a regular sleep wake schedule, avoiding electronics in the bedroom, keeping the bedroom cool, dark, and quiet, and avoiding eating or exercising within 2 hours of bedtime as well as eating in the middle of the night. I advised pt to keep pets away from the bedtime. I discussed with pt the importance of stress relief and to try meditation, deep breathing exercises, and/or a white noise machine or fan to diffuse other noise distracters. I advised pt to not drink alcohol before bedtime and to never mix alcohol and sedating medications. Pt was advised to avoid narcotic pain medication close to bedtime. I advised pt that a copy of these sleep study results will be sent to Dr. Leta Baptist and pt will follow up with him as planned. Pt verbalized understanding of results. Pt had no questions at this time but was encouraged to call back if questions arise.

## 2017-06-27 ENCOUNTER — Ambulatory Visit: Payer: Medicaid Other | Admitting: Diagnostic Neuroimaging

## 2017-07-24 ENCOUNTER — Ambulatory Visit: Payer: Medicaid Other | Admitting: Diagnostic Neuroimaging

## 2017-07-25 ENCOUNTER — Encounter: Payer: Self-pay | Admitting: Diagnostic Neuroimaging

## 2017-11-27 HISTORY — PX: LIPOSUCTION: SHX10

## 2017-12-02 ENCOUNTER — Emergency Department (HOSPITAL_COMMUNITY): Payer: Medicaid Other

## 2017-12-02 ENCOUNTER — Encounter (HOSPITAL_COMMUNITY): Payer: Self-pay | Admitting: Emergency Medicine

## 2017-12-02 ENCOUNTER — Observation Stay (HOSPITAL_COMMUNITY)
Admission: EM | Admit: 2017-12-02 | Discharge: 2017-12-03 | Disposition: A | Discharge status: Home / Self Care | Payer: Medicaid Other | Attending: Internal Medicine | Admitting: Internal Medicine

## 2017-12-02 ENCOUNTER — Observation Stay (HOSPITAL_COMMUNITY): Payer: Medicaid Other

## 2017-12-02 DIAGNOSIS — N6001 Solitary cyst of right breast: Secondary | ICD-10-CM | POA: Diagnosis not present

## 2017-12-02 DIAGNOSIS — D649 Anemia, unspecified: Secondary | ICD-10-CM | POA: Diagnosis not present

## 2017-12-02 DIAGNOSIS — E43 Unspecified severe protein-calorie malnutrition: Secondary | ICD-10-CM | POA: Insufficient documentation

## 2017-12-02 DIAGNOSIS — E876 Hypokalemia: Secondary | ICD-10-CM | POA: Diagnosis not present

## 2017-12-02 DIAGNOSIS — R000 Tachycardia, unspecified: Secondary | ICD-10-CM | POA: Diagnosis not present

## 2017-12-02 DIAGNOSIS — R600 Localized edema: Secondary | ICD-10-CM | POA: Insufficient documentation

## 2017-12-02 DIAGNOSIS — Z90710 Acquired absence of both cervix and uterus: Secondary | ICD-10-CM | POA: Diagnosis not present

## 2017-12-02 DIAGNOSIS — R112 Nausea with vomiting, unspecified: Secondary | ICD-10-CM | POA: Insufficient documentation

## 2017-12-02 DIAGNOSIS — Z79899 Other long term (current) drug therapy: Secondary | ICD-10-CM | POA: Diagnosis not present

## 2017-12-02 DIAGNOSIS — Z9049 Acquired absence of other specified parts of digestive tract: Secondary | ICD-10-CM | POA: Diagnosis not present

## 2017-12-02 DIAGNOSIS — K449 Diaphragmatic hernia without obstruction or gangrene: Secondary | ICD-10-CM | POA: Insufficient documentation

## 2017-12-02 DIAGNOSIS — Z9889 Other specified postprocedural states: Secondary | ICD-10-CM | POA: Diagnosis not present

## 2017-12-02 DIAGNOSIS — G43009 Migraine without aura, not intractable, without status migrainosus: Secondary | ICD-10-CM | POA: Insufficient documentation

## 2017-12-02 LAB — CBC WITH DIFFERENTIAL/PLATELET
Basophils Absolute: 0 10*3/uL (ref 0.0–0.1)
Basophils Relative: 0 %
EOS PCT: 2 %
Eosinophils Absolute: 0.2 10*3/uL (ref 0.0–0.7)
HCT: 25.8 % — ABNORMAL LOW (ref 36.0–46.0)
Hemoglobin: 8.6 g/dL — ABNORMAL LOW (ref 12.0–15.0)
Lymphocytes Relative: 26 %
Lymphs Abs: 2.8 10*3/uL (ref 0.7–4.0)
MCH: 29.2 pg (ref 26.0–34.0)
MCHC: 33.3 g/dL (ref 30.0–36.0)
MCV: 87.5 fL (ref 78.0–100.0)
MONO ABS: 0.9 10*3/uL (ref 0.1–1.0)
MONOS PCT: 9 %
NEUTROS ABS: 6.6 10*3/uL (ref 1.7–7.7)
NEUTROS PCT: 63 %
Platelets: 415 10*3/uL — ABNORMAL HIGH (ref 150–400)
RBC: 2.95 MIL/uL — ABNORMAL LOW (ref 3.87–5.11)
RDW: 13.8 % (ref 11.5–15.5)
WBC: 10.6 10*3/uL — AB (ref 4.0–10.5)

## 2017-12-02 LAB — URINALYSIS, ROUTINE W REFLEX MICROSCOPIC
BILIRUBIN URINE: NEGATIVE
GLUCOSE, UA: NEGATIVE mg/dL
HGB URINE DIPSTICK: NEGATIVE
Ketones, ur: 20 mg/dL — AB
Leukocytes, UA: NEGATIVE
Nitrite: NEGATIVE
Protein, ur: NEGATIVE mg/dL
Specific Gravity, Urine: 1.018 (ref 1.005–1.030)
pH: 5 (ref 5.0–8.0)

## 2017-12-02 LAB — BASIC METABOLIC PANEL
ANION GAP: 9 (ref 5–15)
BUN: 11 mg/dL (ref 6–20)
CALCIUM: 8.4 mg/dL — AB (ref 8.9–10.3)
CO2: 27 mmol/L (ref 22–32)
CREATININE: 0.55 mg/dL (ref 0.44–1.00)
Chloride: 103 mmol/L (ref 98–111)
Glucose, Bld: 109 mg/dL — ABNORMAL HIGH (ref 70–99)
Potassium: 3.7 mmol/L (ref 3.5–5.1)
Sodium: 139 mmol/L (ref 135–145)

## 2017-12-02 LAB — I-STAT CG4 LACTIC ACID, ED: LACTIC ACID, VENOUS: 1.12 mmol/L (ref 0.5–1.9)

## 2017-12-02 LAB — HEMOGLOBIN AND HEMATOCRIT, BLOOD
HEMATOCRIT: 22.5 % — AB (ref 36.0–46.0)
HEMOGLOBIN: 7.3 g/dL — AB (ref 12.0–15.0)

## 2017-12-02 LAB — TYPE AND SCREEN
ABO/RH(D): O POS
ANTIBODY SCREEN: NEGATIVE

## 2017-12-02 MED ORDER — SODIUM CHLORIDE 0.9 % IV BOLUS
1000.0000 mL | Freq: Once | INTRAVENOUS | Status: AC
  Administered 2017-12-02: 1000 mL via INTRAVENOUS

## 2017-12-02 MED ORDER — ACETAMINOPHEN 500 MG PO TABS
1000.0000 mg | ORAL_TABLET | Freq: Once | ORAL | Status: AC
  Administered 2017-12-02: 1000 mg via ORAL
  Filled 2017-12-02: qty 2

## 2017-12-02 MED ORDER — ONDANSETRON HCL 4 MG/2ML IJ SOLN
4.0000 mg | Freq: Once | INTRAMUSCULAR | Status: AC
Start: 2017-12-02 — End: 2017-12-02
  Administered 2017-12-02: 4 mg via INTRAVENOUS
  Filled 2017-12-02: qty 2

## 2017-12-02 MED ORDER — ACETAMINOPHEN 325 MG PO TABS
650.0000 mg | ORAL_TABLET | Freq: Four times a day (QID) | ORAL | Status: DC | PRN
Start: 2017-12-02 — End: 2017-12-03
  Administered 2017-12-03 (×2): 650 mg via ORAL
  Filled 2017-12-02 (×2): qty 2

## 2017-12-02 MED ORDER — ACETAMINOPHEN 650 MG RE SUPP: 650.0000 mg | Freq: Four times a day (QID) | RECTAL | Status: DC | PRN

## 2017-12-02 MED ORDER — SODIUM CHLORIDE 0.9 % IV BOLUS
500.0000 mL | Freq: Once | INTRAVENOUS | Status: AC
  Administered 2017-12-02: 500 mL via INTRAVENOUS

## 2017-12-02 MED ORDER — IOPAMIDOL (ISOVUE-370) INJECTION 76%
INTRAVENOUS | Status: AC
Start: 2017-12-02 — End: 2017-12-03
  Filled 2017-12-02: qty 100

## 2017-12-02 MED ORDER — IOPAMIDOL (ISOVUE-370) INJECTION 76%
100.0000 mL | Freq: Once | INTRAVENOUS | Status: AC | PRN
Start: 2017-12-02 — End: 2017-12-02
  Administered 2017-12-02: 100 mL via INTRAVENOUS

## 2017-12-02 MED ORDER — SODIUM CHLORIDE 0.9 % IV BOLUS
1500.0000 mL | Freq: Once | INTRAVENOUS | Status: AC
  Administered 2017-12-02: 1500 mL via INTRAVENOUS

## 2017-12-02 MED ORDER — ONDANSETRON HCL 4 MG/2ML IJ SOLN
4.0000 mg | Freq: Once | INTRAMUSCULAR | Status: AC
  Administered 2017-12-02: 4 mg via INTRAVENOUS
  Filled 2017-12-02: qty 2

## 2017-12-02 MED ORDER — SODIUM CHLORIDE 0.9 % IV BOLUS: 30.0000 mL/kg | Freq: Once | INTRAVENOUS | Status: DC

## 2017-12-02 MED ORDER — SODIUM CHLORIDE 0.9 % IV SOLN
INTRAVENOUS | Status: DC
  Administered 2017-12-03: via INTRAVENOUS

## 2017-12-02 NOTE — ED Notes (Signed)
Patient is pleasant now that she is laying down. Reports that she had lipo surgery and now has severe pain and dizziness. Bleeding.

## 2017-12-02 NOTE — H&P (Signed)
[facility] H&P   Patient Demographics:    Rosena Bartle, is a 37 y.o. female  MRN: [id number]   DOB - [birth year]  Admit Date - 12/02/2017  Outpatient Primary MD for the patient is Patient, No Pcp Per Dr. Micah Noel [facility] [place])   Referring MD/NP/PA:  Franchot Heidelberg  Outpatient Specialists:    Patient coming from: home  Chief Complaint  Patient presents with  . Nausea  . Emesis      HPI:    Darothy Courtright  is a 37 y.o. female, w recent liposuction c/o nausea /vomitting since yesterday.  Pt felt generally weak.  Denies fever, chills, cp, palp, sob, abd pain, back pain, flank pain, diarrhea, brbpr, vaginal period.  Pt notes has not had any difficulty since liposuction.   In ED,  ED physician was concerned about her anemia.  N/v has resolved while in ED per pt. Pt is persistently tachycardic and was borderline hypotensive in ED, and received 1.5 L of ns iv x1  CTA chest IMPRESSION: 1. No evidence for acute pulmonary embolus. 2. Small hiatal hernia. 3. No pulmonary edema. 4. Benign RIGHT breast cyst. 5. Postoperative edema in the UPPER abdominal subcutaneous fat.  Na 139, K 3.7, Bun 11, Creatinine 0.55 Wbc 10.6, Hgb 8.6, Plt 415 Lactic acid 1.12 Urinalysis negative  Repeat Hgb 7.3 but this was after ns iv   Pt will be admitted for anemia,  Tachycardia.         Review of systems:    In addition to the HPI above,  No Fever-chills, No Headache, No changes with Vision or hearing, No problems swallowing food or Liquids, No Chest pain, Cough or Shortness of Breath, No Abdominal pain, , Bowel movements are regular, No Blood in stool or Urine, No dysuria, No new skin rashes or bruises, No new joints pains-aches,  No new weakness, tingling, numbness in any extremity, No recent weight gain or loss, No polyuria, polydypsia or polyphagia, No significant Mental  Stressors.  A full 10 point Review of Systems was done, except as stated above, all other Review of Systems were negative.   With Past History of the following :    Past Medical History:  Diagnosis Date  . Keratoconus of both eyes 2016  . Migraines       Past Surgical History:  Procedure Laterality Date  . ABDOMINAL HYSTERECTOMY    . ANKLE SURGERY     2 screws in right ankle  . CHOLECYSTECTOMY    . HERNIA REPAIR     2012 and July 2015  . LIPOSUCTION  11/27/2017  . UTERINE FIBROID EMBOLIZATION        Social History:     Social History   Tobacco Use  . Smoking status: Never Smoker  . Smokeless tobacco: Never Used  Substance Use Topics  . Alcohol use: No    Alcohol/week: 0.0 oz  Lives - at home  Mobility - walks by self   Family History :     Family History  Problem Relation Age of Onset  . Healthy Mother   . Diabetes Maternal Grandmother       Home Medications:   Prior to Admission medications   Medication Sig Start Date End Date Taking? Authorizing Provider  acetaminophen (TYLENOL) 500 MG tablet Take 1,000 mg by mouth every 6 (six) hours as needed for moderate pain.   Yes [provider]  ciprofloxacin (CIPRO) 500 MG tablet TAKE ONE TABLET BY MOUTH EVERY 12 HOURS FOR 10 DAYS 11/27/17  Yes [provider]  clindamycin (CLEOCIN) 300 MG capsule TAKE ONE CAPSULE BY MOUTH EVERY 12 HOURS FOR 10 DAYS 11/27/17  Yes [provider]  amitriptyline (ELAVIL) 10 MG tablet Take 1 tablet (10 mg total) by mouth at bedtime. Patient not taking: Reported on 12/02/2017 12/12/16   Penumalli, Earlean Polka, MD  ibuprofen (ADVIL,MOTRIN) 600 MG tablet Take 1 tablet (600 mg total) by mouth every 6 (six) hours as needed. Patient not taking: Reported on 12/02/2017 02/13/17   Charlann Lange, PA-C  topiramate (TOPAMAX) 50 MG tablet Take 1 tablet (50 mg total) by mouth at bedtime. Patient not taking: Reported on 12/12/2016 09/12/16   Penni Bombard, MD      Allergies:    No Known Allergies   Physical Exam:   Vitals  Blood pressure 92/72, pulse (!) 106, temperature 98.5 F (36.9 C), temperature source Oral, resp. rate 19, last menstrual period 01/10/2012, SpO2 100 %.   1. General  lying in bed in NAD,   2. Normal affect and insight, Not Suicidal or Homicidal, Awake Alert, Oriented X 3.  3. No F.N deficits, ALL C.Nerves Intact, Strength 5/5 all 4 extremities, Sensation intact all 4 extremities, Plantars down going.  4. Ears and Eyes appear Normal, Conjunctivae pale, PERRLA. Moist Oral Mucosa.  5. Supple Neck, No JVD, No cervical lymphadenopathy appriciated, No Carotid Bruits.  6. Symmetrical Chest wall movement, Good air movement bilaterally, CTAB.  7. Borderline tachy s1, s2, , No Gallops, Rubs or Murmurs, No Parasternal Heave.  8. Positive Bowel Sounds, Abdomen Soft, No tenderness, No organomegaly appriciated,No rebound -guarding or rigidity.  9.  No Cyanosis, Normal Skin Turgor, No Skin Rash or Bruise.  10. Good muscle tone,  joints appear normal , no effusions, Normal ROM.  11. No Palpable Lymph Nodes in Neck or Axillae     Data Review:    CBC Recent Labs  Lab 12/02/17 1033 12/02/17 1946  WBC 10.6*  --   HGB 8.6* 7.3*  HCT 25.8* 22.5*  PLT 415*  --   MCV 87.5  --   MCH 29.2  --   MCHC 33.3  --   RDW 13.8  --   LYMPHSABS 2.8  --   MONOABS 0.9  --   EOSABS 0.2  --   BASOSABS 0.0  --    ------------------------------------------------------------------------------------------------------------------  Chemistries  Recent Labs  Lab 12/02/17 1033  NA 139  K 3.7  CL 103  CO2 27  GLUCOSE 109*  BUN 11  CREATININE 0.55  CALCIUM 8.4*   ------------------------------------------------------------------------------------------------------------------ CrCl cannot be calculated (Unknown ideal  weight.). ------------------------------------------------------------------------------------------------------------------ No results for input(s): TSH, T4TOTAL, T3FREE, THYROIDAB in the last 72 hours.  Invalid input(s): FREET3  Coagulation profile No results for input(s): INR, PROTIME in the last 168 hours. ------------------------------------------------------------------------------------------------------------------- No results for input(s): DDIMER in the last 72 hours. -------------------------------------------------------------------------------------------------------------------  Cardiac Enzymes  No results for input(s): CKMB, TROPONINI, MYOGLOBIN in the last 168 hours.  Invalid input(s): CK ------------------------------------------------------------------------------------------------------------------ No results found for: BNP   ---------------------------------------------------------------------------------------------------------------  Urinalysis    Component Value Date/Time   COLORURINE YELLOW 12/02/2017 1147   APPEARANCEUR CLEAR 12/02/2017 1147   LABSPEC 1.018 12/02/2017 1147   PHURINE 5.0 12/02/2017 1147   GLUCOSEU NEGATIVE 12/02/2017 1147   HGBUR NEGATIVE 12/02/2017 1147   BILIRUBINUR NEGATIVE 12/02/2017 1147   KETONESUR 20 (A) 12/02/2017 1147   PROTEINUR NEGATIVE 12/02/2017 1147   UROBILINOGEN 1.0 05/01/2014 1826   NITRITE NEGATIVE 12/02/2017 1147   LEUKOCYTESUR NEGATIVE 12/02/2017 1147    ----------------------------------------------------------------------------------------------------------------   Imaging Results:    Ct Angio Chest Pe W/cm &/or Wo Cm  Result Date: 12/02/2017 CLINICAL DATA:  Severe pain and dizziness following liposuction. EXAM: CT ANGIOGRAPHY CHEST WITH CONTRAST TECHNIQUE: Multidetector CT imaging of the chest was performed using the standard protocol during bolus administration of intravenous contrast. Multiplanar CT image  reconstructions and MIPs were obtained to evaluate the vascular anatomy. CONTRAST:  121mL ISOVUE-370 IOPAMIDOL (ISOVUE-370) INJECTION 76% COMPARISON:  Chest x-ray on 06/14/2016, mammogram and ultrasound 07/07/2016 FINDINGS: Cardiovascular: Pulmonary arteries are well opacified by contrast bolus. There is no acute pulmonary embolus. Thoracic aorta is normal in appearance. Heart size is normal. No pericardial effusion or significant coronary artery calcification. Mediastinum/Nodes: The visualized portion of the thyroid gland has a normal appearance. Small hiatal hernia. Otherwise the esophagus is normal. No mediastinal, hilar, or axillary adenopathy. Lungs/Pleura: Lungs are clear. No pleural effusion or pneumothorax. Upper Abdomen: No acute abnormality. Musculoskeletal: An oval circumscribed mass is identified within the RIGHT breast, consistent with cyst, now measuring 3.8 x 5.2 centimeters. No acute or significant osseous findings. Within the UPPER abdominal wall, there is subcutaneous edema consistent with recent procedure. Review of the MIP images confirms the above findings. IMPRESSION: 1. No evidence for acute pulmonary embolus. 2. Small hiatal hernia. 3. No pulmonary edema. 4. Benign RIGHT breast cyst. 5. Postoperative edema in the UPPER abdominal subcutaneous fat. Electronically Signed   By: Nolon Nations M.D.   On: 12/02/2017 18:41       Assessment & Plan:    Principal Problem:   Anemia Active Problems:   Tachycardia   Nausea & vomiting   Anemia likely related to surgery Check ferritin, iron, tibc, folate, b12,  Check cbc at 11pm and in am  Tachycardia  Tele Check tsh Check trop I Check 12 lead ekg  N/v  Check ct scan abd/ pelvis r/o obstruction, ileus zofran 4mg  iv q6h prn    DVT Prophylaxis Heparin -  Lovenox - SCDs   AM Labs Ordered, also please review Full Orders  Family Communication: Admission, patients condition and plan of care including tests being ordered have been  discussed with the patient  who indicate understanding and agree with the plan and Code Status.  Code Status  FULL CODE  Likely DC to  home  Condition GUARDED   Consults called: none  Admission status: observation   Time spent in minutes : 60   Jani Gravel M.D on 12/02/2017 at 8:16 PM  Between 7am to 7pm - Pager - [phone]  . After 7pm go to [email or web address] - password [facility]  [facility] - Office  [phone]

## 2017-12-02 NOTE — ED Triage Notes (Signed)
Patient here from home with complaints of nausea,vomiting. Reports that she had surgery on yesterday. Unsure of what type of surgery patient is not forthcoming with information and refused vitals.

## 2017-12-02 NOTE — Progress Notes (Signed)
ED TO INPATIENT HANDOFF REPORT  Name/Age/Gender Mackenzie [name] 37 y.o. female  Code Status    Code Status Orders  (From admission, onward)        Start     Ordered   12/02/17 2027  Full code  Continuous     12/02/17 2027    Code Status History    This patient has a current code status but no historical code status.      Home/SNF/Other Home  Chief Complaint emesis/dizziness/shob-post surgical  Level of Care/Admitting Diagnosis ED Disposition    ED Disposition Condition Comment   Admit  Hospital Area: [facility] [[id number]]  Level of Care: Telemetry [5]  Admit to tele based on following criteria: Monitor for Ischemic changes  Diagnosis: Anemia [989211]  Admitting Physician: Jani Gravel [[id number]]  Attending Physician: Jani Gravel [[id number]]  PT Class (Do Not Modify): Observation [104]  PT Acc Code (Do Not Modify): Observation [10022]       Medical History Past Medical History:  Diagnosis Date  . Keratoconus of both eyes 2016  . Migraines     Allergies No Known Allergies  IV Location/Drains/Wounds Patient Lines/Drains/Airways Status   Active Line/Drains/Airways    Name:   Placement date:   Placement time:   Site:   Days:   Peripheral IV 12/02/17 Right;Medial Forearm   12/02/17    1020    Forearm   less than 1          Labs/Imaging Results for orders placed or performed during the hospital encounter of 12/02/17 (from the past 48 hour(s))  Basic metabolic panel     Status: Abnormal   Collection Time: 12/02/17 10:33 AM  Result Value Ref Range   Sodium 139 135 - 145 mmol/L   Potassium 3.7 3.5 - 5.1 mmol/L   Chloride 103 98 - 111 mmol/L    Comment: Please note change in reference range.   CO2 27 22 - 32 mmol/L   Glucose, Bld 109 (H) 70 - 99 mg/dL    Comment: Please note change in reference range.   BUN 11 6 - 20 mg/dL    Comment: Please note change in reference range.   Creatinine, Ser 0.55 0.44 - 1.00 mg/dL   Calcium 8.4 (L) 8.9 - 10.3  mg/dL   GFR calc non Af Amer >60 >60 mL/min   GFR calc Af Amer >60 >60 mL/min    Comment: (NOTE) The eGFR has been calculated using the CKD EPI equation. This calculation has not been validated in all clinical situations. eGFR's persistently <60 mL/min signify possible Chronic Kidney Disease.    Anion gap 9 5 - 15    Comment: Performed at [facility], [place] [address] [place], [place] [address]  CBC with Differential     Status: Abnormal   Collection Time: 12/02/17 10:33 AM  Result Value Ref Range   WBC 10.6 (H) 4.0 - 10.5 K/uL   RBC 2.95 (L) 3.87 - 5.11 MIL/uL   Hemoglobin 8.6 (L) 12.0 - 15.0 g/dL   HCT 25.8 (L) 36.0 - 46.0 %   MCV 87.5 78.0 - 100.0 fL   MCH 29.2 26.0 - 34.0 pg   MCHC 33.3 30.0 - 36.0 g/dL   RDW 13.8 11.5 - 15.5 %   Platelets 415 (H) 150 - 400 K/uL   Neutrophils Relative % 63 %   Neutro Abs 6.6 1.7 - 7.7 K/uL   Lymphocytes Relative 26 %   Lymphs Abs 2.8 0.7 -  4.0 K/uL   Monocytes Relative 9 %   Monocytes Absolute 0.9 0.1 - 1.0 K/uL   Eosinophils Relative 2 %   Eosinophils Absolute 0.2 0.0 - 0.7 K/uL   Basophils Relative 0 %   Basophils Absolute 0.0 0.0 - 0.1 K/uL    Comment: Performed at [facility], [place] [address] [place], [place] [address]  I-Stat CG4 Lactic Acid, ED     Status: None   Collection Time: 12/02/17 10:41 AM  Result Value Ref Range   Lactic Acid, Venous 1.12 0.5 - 1.9 mmol/L  Urinalysis, Routine w reflex microscopic     Status: Abnormal   Collection Time: 12/02/17 11:47 AM  Result Value Ref Range   Color, Urine YELLOW YELLOW   APPearance CLEAR CLEAR   Specific Gravity, Urine 1.018 1.005 - 1.030   pH 5.0 5.0 - 8.0   Glucose, UA NEGATIVE NEGATIVE mg/dL   Hgb urine dipstick NEGATIVE NEGATIVE   Bilirubin Urine NEGATIVE NEGATIVE   Ketones, ur 20 (A) NEGATIVE mg/dL   Protein, ur NEGATIVE NEGATIVE mg/dL   Nitrite NEGATIVE NEGATIVE   Leukocytes, UA NEGATIVE NEGATIVE    Comment: Performed at [facility], [place] [address], [place], [place] [address]  Type and screen     Status: None   Collection Time: 12/02/17  2:35 PM  Result Value Ref Range   ABO/RH(D) O POS    Antibody Screen NEG    Sample Expiration      12/05/2017 Performed at [facility], [place] [address] [place], [place] [address]   Hemoglobin and hematocrit, blood     Status: Abnormal   Collection Time: 12/02/17  7:46 PM  Result Value Ref Range   Hemoglobin 7.3 (L) 12.0 - 15.0 g/dL   HCT 22.5 (L) 36.0 - 46.0 %    Comment: Performed at [facility], [place] [address], [place], [place] [address]   Ct Angio Chest Pe W/cm &/or Wo Cm  Result Date: 12/02/2017 CLINICAL DATA:  Severe pain and dizziness following liposuction. EXAM: CT ANGIOGRAPHY CHEST WITH CONTRAST TECHNIQUE: Multidetector CT imaging of the chest was performed using the standard protocol during bolus administration of intravenous contrast. Multiplanar CT image reconstructions and MIPs were obtained to evaluate the vascular anatomy. CONTRAST:  11m ISOVUE-370 IOPAMIDOL (ISOVUE-370) INJECTION 76% COMPARISON:  Chest x-ray on 06/14/2016, mammogram and ultrasound 07/07/2016 FINDINGS: Cardiovascular: Pulmonary arteries are well opacified by contrast bolus. There is no acute pulmonary embolus. Thoracic aorta is normal in appearance. Heart size is normal. No pericardial effusion or significant coronary artery calcification. Mediastinum/Nodes: The visualized portion of the thyroid gland has a normal appearance. Small hiatal hernia. Otherwise the esophagus is normal. No mediastinal, hilar, or axillary adenopathy. Lungs/Pleura: Lungs are clear. No pleural effusion or pneumothorax. Upper Abdomen: No acute abnormality. Musculoskeletal: An oval circumscribed mass is identified within the RIGHT breast, consistent with cyst, now measuring 3.8 x 5.2 centimeters. No acute or significant osseous findings. Within the UPPER abdominal wall, there is  subcutaneous edema consistent with recent procedure. Review of the MIP images confirms the above findings. IMPRESSION: 1. No evidence for acute pulmonary embolus. 2. Small hiatal hernia. 3. No pulmonary edema. 4. Benign RIGHT breast cyst. 5. Postoperative edema in the UPPER abdominal subcutaneous fat. Electronically Signed   By: ENolon NationsM.D.   On: 12/02/2017 18:41    Pending Labs Unresulted Labs (From admission, onward)   Start     Ordered   12/03/17 0500  Comprehensive metabolic panel  Tomorrow morning,   R     12/02/17 2027   12/03/17 0500  CBC  Tomorrow morning,   R     12/02/17 2027   12/02/17 2330  CBC  Once,   R     12/02/17 2027   12/02/17 2330  Troponin I  Once,   R     12/02/17 2029   12/02/17 2026  Ferritin  Add-on,   R     12/02/17 2027   12/02/17 2026  Iron and TIBC  Add-on,   R     12/02/17 2027   12/02/17 2026  Vitamin B12  Add-on,   R     12/02/17 2027   12/02/17 2026  Folate RBC  Add-on,   R     12/02/17 2027   12/02/17 2026  TSH  Add-on,   R     12/02/17 2027   12/02/17 2026  HIV antibody (Routine Testing)  Once,   R     12/02/17 2027      Vitals/Pain Today's Vitals   12/02/17 1742 12/02/17 1800 12/02/17 1830 12/02/17 2046  BP:  110/68 92/72 (!) 114/56  Pulse:   (!) 106 (!) 115  Resp:   19 18  Temp:      TempSrc:      SpO2:   100% 100%  PainSc: 3        Isolation Precautions No active isolations  Medications Medications  0.9 %  sodium chloride infusion (has no administration in time range)  acetaminophen (TYLENOL) tablet 650 mg (has no administration in time range)    Or  acetaminophen (TYLENOL) suppository 650 mg (has no administration in time range)  sodium chloride 0.9 % bolus 1,000 mL (0 mLs Intravenous Stopped 12/02/17 1159)  ondansetron (ZOFRAN) injection 4 mg (4 mg Intravenous Given 12/02/17 1042)  sodium chloride 0.9 % bolus 1,500 mL (0 mLs Intravenous Stopped 12/02/17 1556)  acetaminophen (TYLENOL) tablet 1,000 mg (1,000 mg Oral  Given 12/02/17 1712)  ondansetron (ZOFRAN) injection 4 mg (4 mg Intravenous Given 12/02/17 1905)  sodium chloride 0.9 % bolus 500 mL (500 mLs Intravenous New Bag/Given 12/02/17 1905)  iopamidol (ISOVUE-370) 76 % injection 100 mL (100 mLs Intravenous Contrast Given 12/02/17 1812)    Mobility walks

## 2017-12-02 NOTE — ED Provider Notes (Signed)
Mackenzie [name] signed out to me by a Mackenzie Kingfisher, PA-C.  Please see previous notes for further history.  In brief, Mackenzie [name] had a liposuction surgery on Monday in [place].  Prior to surgery, hemoglobin was between 12 and 14.  Today, hemoglobin is noted to be 8.6.  She presented today due to vomiting for the past 12 to 14 hours.  She is feeling dizzy and nauseous.  Symptoms improved with hydration, although Mackenzie [name] orthostatics remained positive.  Plan to repeat orthostatics after 30 cc/kg fluid bolus.  Orthostatics after fluid bolus concerning, as Mackenzie [name] is tachycardic and hypotensive.  Discussed with Mackenzie [name], she reports continued nausea.  She denies chest pain or shortness of breath.  She reports worsening pain at the site of surgery, states she has not had any Tylenol since 5:00 this morning.  She does not want narcotics.  Will give a dose of Tylenol and reassess heart rate.  Mackenzie [name] remains tachycardic and hypotensive.  Will obtain CTA to rule out PE.  Pt reports return of dizziness. Small bolus of fluid and Zofran given for symptom control.   CTA negative for PE.  Symptoms improved with Zofran and fluids.  Mackenzie [name], however remains tachycardic at 110.  Blood pressure mildly improved to 827 systolic.  Repeat hemoglobin decreased at 7.3.  While this may be due to fluid administration, I am concerned about pt's HR and continued sxs.  Case discussed with attending, Dr. Vallery Ridge agrees to plan.  Will call for admission.  Dicussed with Dr. Maudie Mercury, Mackenzie [name] to be admitted to [facility] hospitalist service. Requesting CT abd/pelvis to look for source of bleeding, will obtain non-con CT abd/pelv.      Franchot Heidelberg, PA-C 12/02/17 2015    Charlesetta Shanks, MD 12/10/17 2300

## 2017-12-02 NOTE — ED Provider Notes (Signed)
[facility] DEPT Provider Note   CSN: [id number] Arrival date & time: 12/02/17  2458     History   Chief Complaint Chief Complaint  Patient presents with  . Nausea  . Emesis    HPI Mackenzie [name] is a 37 y.o. female.  Who presents the emergency department chief complaint of nausea and vomiting.  She is status post liposuction of her bilateral flanks on Monday 12/07/2017 in [place]. She states that she was doing well up until yesterday when she began having severe nausea and intractable vomiting.  She says that she feels dizzy whenever she tries to sit up or move but denies vertigo.  She denies any significant pain either at her surgical sites or in her abdomen.  She denies fevers or chills.  HPI  Past Medical History:  Diagnosis Date  . Keratoconus of both eyes 2016  . Migraines     Patient Active Problem List   Diagnosis Date Noted  . Anemia 12/02/2017  . Tachycardia 12/02/2017  . Nausea & vomiting 12/02/2017  . Migraines   . Migraine without aura and without status migrainosus, not intractable 07/02/2014  . Blurred vision 05/09/2014  . Keratosis 05/09/2014    Past Surgical History:  Procedure Laterality Date  . ABDOMINAL HYSTERECTOMY    . ANKLE SURGERY     2 screws in right ankle  . CHOLECYSTECTOMY    . HERNIA REPAIR     2012 and July 2015  . LIPOSUCTION  11/27/2017  . UTERINE FIBROID EMBOLIZATION       OB History   None      Home Medications    Prior to Admission medications   Medication Sig Start Date End Date Taking? Authorizing Provider  acetaminophen (TYLENOL) 500 MG tablet Take 1,000 mg by mouth every 6 (six) hours as needed for moderate pain.   Yes [provider]  ciprofloxacin (CIPRO) 500 MG tablet TAKE ONE TABLET BY MOUTH EVERY 12 HOURS FOR 10 DAYS 11/27/17  Yes [provider]  clindamycin (CLEOCIN) 300 MG capsule TAKE ONE CAPSULE BY MOUTH EVERY 12 HOURS FOR 10 DAYS 11/27/17  Yes [provider]  ferrous sulfate 325 (65 FE) MG tablet Take 1 tablet (325 mg total) by mouth daily with breakfast. 12/03/17   Kayleen Memos, DO    Family History Family History  Problem Relation Age of Onset  . Healthy Mother   . Diabetes Maternal Grandmother     Social History Social History   Tobacco Use  . Smoking status: Never Smoker  . Smokeless tobacco: Never Used  Substance Use Topics  . Alcohol use: No    Alcohol/week: 0.0 oz  . Drug use: No     Allergies   Patient has no known allergies.   Review of Systems Review of Systems Ten systems reviewed and are negative for acute change, except as noted in the HPI.    Physical Exam Updated Vital Signs BP 103/61 (BP Location: Right Arm)   Pulse (!) 105   Temp 99 F (37.2 C) (Oral)   Resp 16   Wt 90.6 kg (199 lb 11.2 oz)   LMP 01/10/2012   SpO2 100%   BMI 33.23 kg/m   Physical Exam  Constitutional: She is oriented to person, place, and time. She appears well-developed and well-nourished. No distress.  HENT:  Head: Normocephalic and atraumatic.  Eyes: Conjunctivae are normal. No scleral icterus.  Neck: Normal range of motion.  Cardiovascular: Normal rate, regular rhythm  and normal heart sounds. Exam reveals no gallop and no friction rub.  No murmur heard. Pulmonary/Chest: Effort normal and breath sounds normal. No respiratory distress.  Abdominal: Soft. Bowel sounds are normal. She exhibits no distension and no mass. There is no tenderness. There is no guarding.  Patient states surgical incision sites on her abdomen and posterior thorax and flakes are well healing, no signs of infection, no drainage, minimal tenderness.  No significant bruising over the abdomen or thoracic region.  Neurological: She is alert and oriented to person, place, and time.  Skin: Skin is warm and dry. She is not diaphoretic.  Psychiatric: Her behavior is normal.  Nursing note and vitals reviewed.    ED Treatments / Results  Labs (all labs  ordered are listed, but only abnormal results are displayed) Labs Reviewed  BASIC METABOLIC PANEL - Abnormal; Notable for the following components:      Result Value   Glucose, Bld 109 (*)    Calcium 8.4 (*)    All other components within normal limits  CBC WITH DIFFERENTIAL/PLATELET - Abnormal; Notable for the following components:   WBC 10.6 (*)    RBC 2.95 (*)    Hemoglobin 8.6 (*)    HCT 25.8 (*)    Platelets 415 (*)    All other components within normal limits  URINALYSIS, ROUTINE W REFLEX MICROSCOPIC - Abnormal; Notable for the following components:   Ketones, ur 20 (*)    All other components within normal limits  HEMOGLOBIN AND HEMATOCRIT, BLOOD - Abnormal; Notable for the following components:   Hemoglobin 7.3 (*)    HCT 22.5 (*)    All other components within normal limits  CBC - Abnormal; Notable for the following components:   WBC 11.0 (*)    RBC 2.59 (*)    Hemoglobin 7.6 (*)    HCT 23.2 (*)    All other components within normal limits  IRON AND TIBC - Abnormal; Notable for the following components:   TIBC 205 (*)    All other components within normal limits  CBC - Abnormal; Notable for the following components:   RBC 2.62 (*)    Hemoglobin 7.6 (*)    HCT 23.4 (*)    All other components within normal limits  COMPREHENSIVE METABOLIC PANEL - Abnormal; Notable for the following components:   Potassium 3.4 (*)    Glucose, Bld 130 (*)    Calcium 7.8 (*)    Total Protein 5.3 (*)    Albumin 2.3 (*)    All other components within normal limits  FERRITIN  VITAMIN B12  TSH  TROPONIN I  FOLATE RBC  HIV ANTIBODY (ROUTINE TESTING)  I-STAT CG4 LACTIC ACID, ED  TYPE AND SCREEN  ABO/RH    EKG EKG Interpretation  Date/Time:  Saturday December 02 2017 20:42:45 EDT Ventricular Rate:  133 PR Interval:    QRS Duration: 67 QT Interval:  291 QTC Calculation: 433 R Axis:   35 Text Interpretation:  Sinus tachycardia Low voltage, precordial leads Borderline T  abnormalities, anterior leads Baseline wander in lead(s) V6 No old tracing to compare Confirmed by Merrily Pew [phone]) on 12/03/2017 3:11:56 PM   Radiology Ct Abdomen Pelvis Wo Contrast  Result Date: 12/02/2017 CLINICAL DATA:  Liposuction in [place] Monday. Anemia. Vomiting for the past 12 hours. Dizzy and nauseated. Rule out retroperitoneal bleeding. EXAM: CT ABDOMEN AND PELVIS WITHOUT CONTRAST TECHNIQUE: Multidetector CT imaging of the abdomen and pelvis was performed following the standard protocol without  IV contrast. COMPARISON:  Chest CT of earlier today. Most recent abdominopelvic CT of 06/14/2016. FINDINGS: Lower chest: Deferred to today's chest CT dictated separately. Hepatobiliary: Normal liver. Cholecystectomy, without biliary ductal dilatation. Pancreas: Normal, without mass or ductal dilatation. Spleen: Normal in size, without focal abnormality. Adrenals/Urinary Tract: Normal adrenal glands. Normal kidneys, without hydronephrosis. Contrast within the collecting systems and ureters from the chest CT of earlier today. There is also bladder contrast without filling defect. Stomach/Bowel: Tiny hiatal hernia. Normal remainder of the stomach. Colonic stool burden suggests constipation. Normal terminal ileum and appendix. Normal small bowel. Vascular/Lymphatic: Normal caliber of the aorta and branch vessels. No abdominopelvic adenopathy. Reproductive: Hysterectomy.  No adnexal mass. Other: No significant free fluid.  No free intraperitoneal air. Musculoskeletal: Since 06/14/2016, development of marked diffuse subcutaneous edema. Somewhat more focal fluid density about the left lateral abdominal wall measures on the order of 8.7 x 2.6 cm on 53/2. Less well-defined right lower abdominal wall collection or collections measure 2.1 x 6.6 cm on 49/2 and 7.6 x 2.6 cm more inferiorly on 61/2. More cephalad bilateral lateral abdominal wall somewhat ill-defined fluid collections are also identified at 7.3 x 2.2 cm  on the left on 24/2 and 7.6 x 1.9 cm on the right on 23/2. Subcutaneous gas about the right flank and left inguinal region is likely iatrogenic. No acute osseous abnormality. IMPRESSION: 1. Interval development of significant subcutaneous edema about the abdomen and upper pelvis, likely postoperative. Fluid collections about the lateral abdominal walls are presumably postoperative seromas or chronic hematomas. No gas within to confirm abscess 2. No intra-abdominal postoperative finding. No intraperitoneal or retroperitoneal fluid collection. 3.  Possible constipation. 4.  Tiny hiatal hernia. Electronically Signed   By: Eliyohu Class Miyamoto M.D.   On: 12/02/2017 21:36   Ct Angio Chest Pe W/cm &/or Wo Cm  Result Date: 12/02/2017 CLINICAL DATA:  Severe pain and dizziness following liposuction. EXAM: CT ANGIOGRAPHY CHEST WITH CONTRAST TECHNIQUE: Multidetector CT imaging of the chest was performed using the standard protocol during bolus administration of intravenous contrast. Multiplanar CT image reconstructions and MIPs were obtained to evaluate the vascular anatomy. CONTRAST:  186mL ISOVUE-370 IOPAMIDOL (ISOVUE-370) INJECTION 76% COMPARISON:  Chest x-ray on 06/14/2016, mammogram and ultrasound 07/07/2016 FINDINGS: Cardiovascular: Pulmonary arteries are well opacified by contrast bolus. There is no acute pulmonary embolus. Thoracic aorta is normal in appearance. Heart size is normal. No pericardial effusion or significant coronary artery calcification. Mediastinum/Nodes: The visualized portion of the thyroid gland has a normal appearance. Small hiatal hernia. Otherwise the esophagus is normal. No mediastinal, hilar, or axillary adenopathy. Lungs/Pleura: Lungs are clear. No pleural effusion or pneumothorax. Upper Abdomen: No acute abnormality. Musculoskeletal: An oval circumscribed mass is identified within the RIGHT breast, consistent with cyst, now measuring 3.8 x 5.2 centimeters. No acute or significant osseous findings.  Within the UPPER abdominal wall, there is subcutaneous edema consistent with recent procedure. Review of the MIP images confirms the above findings. IMPRESSION: 1. No evidence for acute pulmonary embolus. 2. Small hiatal hernia. 3. No pulmonary edema. 4. Benign RIGHT breast cyst. 5. Postoperative edema in the UPPER abdominal subcutaneous fat. Electronically Signed   By: Nolon Nations M.D.   On: 12/02/2017 18:41    Procedures Procedures (including critical care time)  Medications Ordered in ED Medications  acetaminophen (TYLENOL) tablet 650 mg (650 mg Oral Given 12/03/17 0738)    Or  acetaminophen (TYLENOL) suppository 650 mg ( Rectal See Alternative 12/03/17 0738)  ferrous sulfate tablet 325 mg (  325 mg Oral Given 12/03/17 1329)  ciprofloxacin (CIPRO) tablet 500 mg (500 mg Oral Given 12/03/17 1419)  clindamycin (CLEOCIN) capsule 300 mg (300 mg Oral Given 12/03/17 1419)  sodium chloride 0.9 % bolus 1,000 mL (0 mLs Intravenous Stopped 12/02/17 1159)  ondansetron (ZOFRAN) injection 4 mg (4 mg Intravenous Given 12/02/17 1042)  sodium chloride 0.9 % bolus 1,500 mL (0 mLs Intravenous Stopped 12/02/17 1556)  acetaminophen (TYLENOL) tablet 1,000 mg (1,000 mg Oral Given 12/02/17 1712)  ondansetron (ZOFRAN) injection 4 mg (4 mg Intravenous Given 12/02/17 1905)  sodium chloride 0.9 % bolus 500 mL (500 mLs Intravenous New Bag/Given 12/02/17 1905)  iopamidol (ISOVUE-370) 76 % injection 100 mL (100 mLs Intravenous Contrast Given 12/02/17 1812)  potassium chloride SA (K-DUR,KLOR-CON) CR tablet 40 mEq (40 mEq Oral Given 12/03/17 1329)     Initial Impression / Assessment and Plan / ED Course  I have reviewed the triage vital signs and the nursing notes.  Pertinent labs & imaging results that were available during my care of the patient were reviewed by me and considered in my medical decision making (see chart for details).  Clinical Course as of Dec 04 1647  Sat Dec 02, 2017  1330 Patient hemoglobin 8.6 today.   She states that prior to her surgery Monday her hemoglobin was 14.  This represents a significant drop and may be the cause of her feeling lightheaded.  Hemoglobin(!): 8.6 [AH]  1357 Patient orthostatics positive.  In the setting of her recent surgery, nausea and vomiting patient will be given further fluid repletion.  Discussed the possibility of blood transfusion with Dr. Ashok Cordia all he does not feel that that is the appropriate course of action at this time.   [AH]    Clinical Course User Index [AH] Margarita Mail, PA-C    Patient with persistent hypotension and tachycardia despite fluid resuscitation.  She is continuing to get fluids and will need repeat orthostatic vital signs.  Patient may need blood transfusion given her recent drop in hemoglobin of almost 6 g.  I have given sign out to PA Caccavale who will assume care of the patient for reevaluation and appropriate disposition.  Final Clinical Impressions(s) / ED Diagnoses   Final diagnoses:  Anemia, unspecified type  Non-intractable vomiting with nausea, unspecified vomiting type    ED Discharge Orders        Ordered    ferrous sulfate 325 (65 FE) MG tablet  Daily with breakfast     12/03/17 1230       Margarita Mail, PA-C 12/03/17 1654    Lajean Saver, MD 12/04/17 1217

## 2017-12-03 ENCOUNTER — Other Ambulatory Visit: Payer: Self-pay

## 2017-12-03 DIAGNOSIS — R112 Nausea with vomiting, unspecified: Secondary | ICD-10-CM | POA: Diagnosis not present

## 2017-12-03 DIAGNOSIS — R000 Tachycardia, unspecified: Secondary | ICD-10-CM | POA: Diagnosis not present

## 2017-12-03 DIAGNOSIS — D649 Anemia, unspecified: Secondary | ICD-10-CM

## 2017-12-03 DIAGNOSIS — E876 Hypokalemia: Secondary | ICD-10-CM | POA: Diagnosis not present

## 2017-12-03 LAB — CBC
HCT: 23.2 % — ABNORMAL LOW (ref 36.0–46.0)
HEMATOCRIT: 23.4 % — AB (ref 36.0–46.0)
HEMOGLOBIN: 7.6 g/dL — AB (ref 12.0–15.0)
Hemoglobin: 7.6 g/dL — ABNORMAL LOW (ref 12.0–15.0)
MCH: 29 pg (ref 26.0–34.0)
MCH: 29.3 pg (ref 26.0–34.0)
MCHC: 32.5 g/dL (ref 30.0–36.0)
MCHC: 32.8 g/dL (ref 30.0–36.0)
MCV: 89.3 fL (ref 78.0–100.0)
MCV: 89.6 fL (ref 78.0–100.0)
Platelets: 368 10*3/uL (ref 150–400)
Platelets: 390 10*3/uL (ref 150–400)
RBC: 2.59 MIL/uL — ABNORMAL LOW (ref 3.87–5.11)
RBC: 2.62 MIL/uL — ABNORMAL LOW (ref 3.87–5.11)
RDW: 14 % (ref 11.5–15.5)
RDW: 14.2 % (ref 11.5–15.5)
WBC: 11 10*3/uL — ABNORMAL HIGH (ref 4.0–10.5)
WBC: 9.4 10*3/uL (ref 4.0–10.5)

## 2017-12-03 LAB — FERRITIN: FERRITIN: 214 ng/mL (ref 11–307)

## 2017-12-03 LAB — COMPREHENSIVE METABOLIC PANEL
ALBUMIN: 2.3 g/dL — AB (ref 3.5–5.0)
ALT: 35 U/L (ref 0–44)
ANION GAP: 7 (ref 5–15)
AST: 34 U/L (ref 15–41)
Alkaline Phosphatase: 45 U/L (ref 38–126)
BILIRUBIN TOTAL: 0.9 mg/dL (ref 0.3–1.2)
BUN: 8 mg/dL (ref 6–20)
CO2: 25 mmol/L (ref 22–32)
Calcium: 7.8 mg/dL — ABNORMAL LOW (ref 8.9–10.3)
Chloride: 106 mmol/L (ref 98–111)
Creatinine, Ser: 0.65 mg/dL (ref 0.44–1.00)
GFR calc Af Amer: >60 mL/min (ref >60)
GLUCOSE: 130 mg/dL — AB (ref 70–99)
POTASSIUM: 3.4 mmol/L — AB (ref 3.5–5.1)
Sodium: 138 mmol/L (ref 135–145)
TOTAL PROTEIN: 5.3 g/dL — AB (ref 6.5–8.1)

## 2017-12-03 LAB — TROPONIN I

## 2017-12-03 LAB — IRON AND TIBC
IRON: 42 ug/dL (ref 28–170)
Saturation Ratios: 20 % (ref 10.4–31.8)
TIBC: 205 ug/dL — AB (ref 250–450)
UIBC: 163 ug/dL

## 2017-12-03 LAB — TSH: TSH: 0.602 u[IU]/mL (ref 0.350–4.500)

## 2017-12-03 LAB — ABO/RH: ABO/RH(D): O POS

## 2017-12-03 LAB — VITAMIN B12: VITAMIN B 12: 269 pg/mL (ref 180–914)

## 2017-12-03 MED ORDER — FERROUS SULFATE 325 (65 FE) MG PO TABS
325.0000 mg | ORAL_TABLET | Freq: Every day | ORAL | Status: DC
  Administered 2017-12-03: 325 mg via ORAL
  Filled 2017-12-03: qty 1

## 2017-12-03 MED ORDER — CLINDAMYCIN HCL 300 MG PO CAPS
300.0000 mg | ORAL_CAPSULE | Freq: Three times a day (TID) | ORAL | Status: DC
  Administered 2017-12-03: 300 mg via ORAL
  Filled 2017-12-03: qty 1

## 2017-12-03 MED ORDER — POTASSIUM CHLORIDE CRYS ER 20 MEQ PO TBCR
40.0000 meq | EXTENDED_RELEASE_TABLET | Freq: Once | ORAL | Status: AC
  Administered 2017-12-03: 40 meq via ORAL
  Filled 2017-12-03: qty 2

## 2017-12-03 MED ORDER — FERROUS SULFATE 325 (65 FE) MG PO TABS: 325.0000 mg | ORAL_TABLET | Freq: Every day | ORAL | 0 refills | Status: DC

## 2017-12-03 MED ORDER — CIPROFLOXACIN HCL 500 MG PO TABS
500.0000 mg | ORAL_TABLET | Freq: Two times a day (BID) | ORAL | Status: DC
  Administered 2017-12-03: 500 mg via ORAL
  Filled 2017-12-03: qty 1

## 2017-12-03 NOTE — Progress Notes (Signed)
PT Cancellation Note  Patient Details Name: Mackenzie [name] MRN: [id number] DOB: 15-Dec-1980   Cancelled Treatment:    Reason Eval/Treat Not Completed: PT screened, no needs identified, will sign off: per patient, after she ate this morning she if feeling better, able to move around room without problems, and denies any changes in function or worries/concerns about walking/balance.  No follow up needs.    [place] Hard [facility] 12/03/2017, 9:58 AM

## 2017-12-03 NOTE — Progress Notes (Signed)
  Pt has become a little anxious, state she feels light headed. Has now agreeded to be placed on teley which shows ST HR 105, 101/59,15,98.8. Paged MD Maudie Mercury to come and speak with the Pt about some of her concerns about her s/s of her light headedness. No new orders at this time, pt  appears to be relieved, after speaking with MD. I will continue to monitor.

## 2017-12-03 NOTE — Discharge Instructions (Signed)
Anemia Anemia is a condition in which you do not have enough red blood cells or hemoglobin. Hemoglobin is a substance in red blood cells that carries oxygen. When you do not have enough red blood cells or hemoglobin (are anemic), your body cannot get enough oxygen and your organs may not work properly. As a result, you may feel very tired or have other problems. What are the causes? Common causes of anemia include:  Excessive bleeding. Anemia can be caused by excessive bleeding inside or outside the body, including bleeding from the intestine or from periods in women.  Poor nutrition.  Long-lasting (chronic) kidney, thyroid, and liver disease.  Bone marrow disorders.  Cancer and treatments for cancer.  HIV (human immunodeficiency virus) and AIDS (acquired immunodeficiency syndrome).  Treatments for HIV and AIDS.  Spleen problems.  Blood disorders.  Infections, medicines, and autoimmune disorders that destroy red blood cells.  What are the signs or symptoms? Symptoms of this condition include:  Minor weakness.  Dizziness.  Headache.  Feeling heartbeats that are irregular or faster than normal (palpitations).  Shortness of breath, especially with exercise.  Paleness.  Cold sensitivity.  Indigestion.  Nausea.  Difficulty sleeping.  Difficulty concentrating.  Symptoms may occur suddenly or develop slowly. If your anemia is mild, you may not have symptoms. How is this diagnosed? This condition is diagnosed based on:  Blood tests.  Your medical history.  A physical exam.  Bone marrow biopsy.  Your health care provider may also check your stool (feces) for blood and may do additional testing to look for the cause of your bleeding. You may also have other tests, including:  Imaging tests, such as a CT scan or MRI.  Endoscopy.  Colonoscopy.  How is this treated? Treatment for this condition depends on the cause. If you continue to lose a lot of blood,  you may need to be treated at a hospital. Treatment may include:  Taking supplements of iron, vitamin T02, or folic acid.  Taking a hormone medicine (erythropoietin) that can help to stimulate red blood cell growth.  Having a blood transfusion. This may be needed if you lose a lot of blood.  Making changes to your diet.  Having surgery to remove your spleen.  Follow these instructions at home:  Take over-the-counter and prescription medicines only as told by your health care provider.  Take supplements only as told by your health care provider.  Follow any diet instructions that you were given.  Keep all follow-up visits as told by your health care provider. This is important. Contact a health care provider if:  You develop new bleeding anywhere in the body. Get help right away if:  You are very weak.  You are short of breath.  You have pain in your abdomen or chest.  You are dizzy or feel faint.  You have trouble concentrating.  You have bloody or black, tarry stools.  You vomit repeatedly or you vomit up blood. Summary  Anemia is a condition in which you do not have enough red blood cells or enough of a substance in your red blood cells that carries oxygen (hemoglobin).  Symptoms may occur suddenly or develop slowly.  If your anemia is mild, you may not have symptoms.  This condition is diagnosed with blood tests as well as a medical history and physical exam. Other tests may be needed.  Treatment for this condition depends on the cause of the anemia. This information is not intended to replace advice  given to you by your health care provider. Make sure you discuss any questions you have with your health care provider. °Document Released: 06/16/2004 Document Revised: 06/10/2016 Document Reviewed: 06/10/2016 °Elsevier Interactive Patient Education © 2018 [employer] ° °

## 2017-12-03 NOTE — Discharge Summary (Addendum)
Discharge Summary  Mackenzie [name] TIW:[id number] DOB: [birth year]  PCP: Patient, No Pcp Per  Admit date: 12/02/2017 Discharge date: 12/03/2017  Time spent: 25 minutes  Recommendations for Outpatient Follow-up:  1. Follow-up with your surgeon- [facility] IN [place] DR HARRY ISTSIFUL (name provided by patient) FOR REFERRAL TO F/U WITH PLASTIC SURGERY IN [place]. 2. Follow-up with your PCP 3. Take your medications as prescribed  Discharge Diagnoses:  Active Hospital Problems   Diagnosis Date Noted  . Anemia 12/02/2017  . Tachycardia 12/02/2017  . Nausea & vomiting 12/02/2017    Resolved Hospital Problems  No resolved problems to display.    Discharge Condition: Stable  Diet recommendation: Resume previous diet  Vitals:   12/03/17 0558 12/03/17 1325  BP: (!) 107/57 103/61  Pulse: 99 (!) 105  Resp:  16  Temp:  99 F (37.2 C)  SpO2:  100%    History of present illness:  Mackenzie [name]  is a 37 y.o. female, w recent liposuction c/o nausea /vomitting since yesterday.  Pt felt generally weak.  Denies fever, chills, cp, palp, sob, abd pain, back pain, flank pain, diarrhea, brbpr, vaginal period.  Pt notes has not had any difficulty since liposuction.   In ED,  ED physician was concerned about her anemia.  N/v has resolved while in ED per pt. Pt is persistently tachycardic and was borderline hypotensive in ED, and received 1.5 L of ns iv x1.  12/03/2017: Patient seen and examined her bedside.  She has no new complaints.  All her previous symptoms have resolved.  She denies chest pain, dyspnea, nausea.  She ate a regular diet and tolerated well.  Patient refused blood transfusion for suspected symptomatic anemia. Started ferrous sulfate and advised to closely follow up with her PCP on Monday 12/04/17. Patient will call for an appointment. She understands and agrees to plan.  On the day of discharge, patient was hemodynamically stable.  She will need to follow-up with her  surgeon in [place], call his office on 12/04/17.  She will also need to follow-up with her primary care provider and take her medications as prescribed.    Hospital Course:  Principal Problem:   Anemia Active Problems:   Tachycardia   Nausea & vomiting  Suspected acute blood loss anemia and symptomatic anemia/Iron deficiency anemia Hemoglobin on presentation 7.6, MCV 89 Patient declines blood transfusion for symptomatic anemia Recent surgical procedure with liposuction in [place] Baseline hemoglobin appears to be 12. Total bilirubin normal- no sign of hemolytic anemia Needs to f/u with her plastic surgeon who did liposuction and obtain referral to a plastic surgeon in town who can follow up. Patient explained this. She understands and agrees to plan. Iron studies done Started on ferrous sulfate supplementation 325 mg daily Follow-up with your primary care provider within a week  Recent abdominal liposuction Follow-up with your plastic surgeon within a week CT abdomen and pelvis without contrast revealed interval development of significant subcutaneous edema about the abdomen and pelvis likely postoperative.  Fluid collections about the lateral abdominal walls are presumably postoperative seromas or chronic hematomas.  No gas within to confirm abscess.  No intra-abdominal postoperative finding.  No intraperitoneal or retroperitoneal fluid collection. Wears an abdominal garnment  Sinus tachycardia Suspect secondary to dehydration versus symptomatic anemia TSH unremarkable No signs of specific ST-T changes on EKG Twelve-lead EKG repeated on 12/03/2017 CTA negative for PE Follow-up with your primary care provider  Hypokalemia, repleted Potassium 3.4 Repleted with 40 meq p.o. potassium  chloride supplement Follow-up with PCP  Severe protein calorie malnutrition Albumin 2.3 Increase p.o. protein calorie intake  Nausea/vomiting, resolved Unclear etiology Afebrile, no  leukocytosis U/A negative     Procedures: None  Consultations:  None  Discharge Exam: BP 103/61 (BP Location: Right Arm)   Pulse (!) 105   Temp 99 F (37.2 C) (Oral)   Resp 16   Wt 90.6 kg (199 lb 11.2 oz)   LMP 01/10/2012   SpO2 100%   BMI 33.23 kg/m  . General: 37 y.o. year-old female well developed well nourished in no acute distress.  Alert and oriented x3. . Cardiovascular: Regular rate and rhythm with no rubs or gallops.  No thyromegaly or JVD noted.   Marland Kitchen Respiratory: Clear to auscultation with no wheezes or rales. Good inspiratory effort. . Abdomen: Soft nontender nondistended with normal bowel sounds. Abdominal Gird in place. . Musculoskeletal: No lower extremity edema. 2/4 pulses in all 4 extremities. Marland Kitchen Psychiatry: Mood is appropriate for condition and setting  Discharge Instructions You were cared for by a hospitalist during your hospital stay. If you have any questions about your discharge medications or the care you received while you were in the hospital after you are discharged, you can call the unit and asked to speak with the hospitalist on call if the hospitalist that took care of you is not available. Once you are discharged, your primary care physician will handle any further medical issues. Please note that NO REFILLS for any discharge medications will be authorized once you are discharged, as it is imperative that you return to your primary care physician (or establish a relationship with a primary care physician if you do not have one) for your aftercare needs so that they can reassess your need for medications and monitor your lab values.   Allergies as of 12/03/2017   No Known Allergies     Medication List    STOP taking these medications   amitriptyline 10 MG tablet Commonly known as:  ELAVIL   ibuprofen 600 MG tablet Commonly known as:  ADVIL,MOTRIN   topiramate 50 MG tablet Commonly known as:  TOPAMAX     TAKE these medications    acetaminophen 500 MG tablet Commonly known as:  TYLENOL Take 1,000 mg by mouth every 6 (six) hours as needed for moderate pain.   ciprofloxacin 500 MG tablet Commonly known as:  CIPRO TAKE ONE TABLET BY MOUTH EVERY 12 HOURS FOR 10 DAYS   clindamycin 300 MG capsule Commonly known as:  CLEOCIN TAKE ONE CAPSULE BY MOUTH EVERY 12 HOURS FOR 10 DAYS   ferrous sulfate 325 (65 FE) MG tablet Take 1 tablet (325 mg total) by mouth daily with breakfast.      No Known Allergies Follow-up Information    Glory Buff, MD. Call in 1 day(s).   Specialty:  Specialist Why:  please call for an appointment on Monday 12/04/17. Contact information: [place] [address] [phone]            The results of significant diagnostics from this hospitalization (including imaging, microbiology, ancillary and laboratory) are listed below for reference.    Significant Diagnostic Studies: Ct Abdomen Pelvis Wo Contrast  Result Date: 12/02/2017 CLINICAL DATA:  [place] in [place]. Anemia. Vomiting for the past 12 hours. Dizzy and nauseated. Rule out retroperitoneal bleeding. EXAM: CT ABDOMEN AND PELVIS WITHOUT CONTRAST TECHNIQUE: Multidetector CT imaging of the abdomen and pelvis was performed following the standard protocol without IV  contrast. COMPARISON:  Chest CT of earlier today. Most recent abdominopelvic CT of 06/14/2016. FINDINGS: Lower chest: Deferred to today's chest CT dictated separately. Hepatobiliary: Normal liver. Cholecystectomy, without biliary ductal dilatation. Pancreas: Normal, without mass or ductal dilatation. Spleen: Normal in size, without focal abnormality. Adrenals/Urinary Tract: Normal adrenal glands. Normal kidneys, without hydronephrosis. Contrast within the collecting systems and ureters from the chest CT of earlier today. There is also bladder contrast without filling defect. Stomach/Bowel: Tiny hiatal hernia. Normal remainder of the stomach. Colonic  stool burden suggests constipation. Normal terminal ileum and appendix. Normal small bowel. Vascular/Lymphatic: Normal caliber of the aorta and branch vessels. No abdominopelvic adenopathy. Reproductive: Hysterectomy.  No adnexal mass. Other: No significant free fluid.  No free intraperitoneal air. Musculoskeletal: Since 06/14/2016, development of marked diffuse subcutaneous edema. Somewhat more focal fluid density about the left lateral abdominal wall measures on the order of 8.7 x 2.6 cm on 53/2. Less well-defined right lower abdominal wall collection or collections measure 2.1 x 6.6 cm on 49/2 and 7.6 x 2.6 cm more inferiorly on 61/2. More cephalad bilateral lateral abdominal wall somewhat ill-defined fluid collections are also identified at 7.3 x 2.2 cm on the left on 24/2 and 7.6 x 1.9 cm on the right on 23/2. Subcutaneous gas about the right flank and left inguinal region is likely iatrogenic. No acute osseous abnormality. IMPRESSION: 1. Interval development of significant subcutaneous edema about the abdomen and upper pelvis, likely postoperative. Fluid collections about the lateral abdominal walls are presumably postoperative seromas or chronic hematomas. No gas within to confirm abscess 2. No intra-abdominal postoperative finding. No intraperitoneal or retroperitoneal fluid collection. 3.  Possible constipation. 4.  Tiny hiatal hernia. Electronically Signed   By: Abigail Miyamoto M.D.   On: 12/02/2017 21:36   Ct Angio Chest Pe W/cm &/or Wo Cm  Result Date: 12/02/2017 CLINICAL DATA:  Severe pain and dizziness following liposuction. EXAM: CT ANGIOGRAPHY CHEST WITH CONTRAST TECHNIQUE: Multidetector CT imaging of the chest was performed using the standard protocol during bolus administration of intravenous contrast. Multiplanar CT image reconstructions and MIPs were obtained to evaluate the vascular anatomy. CONTRAST:  163mL ISOVUE-370 IOPAMIDOL (ISOVUE-370) INJECTION 76% COMPARISON:  Chest x-ray on 06/14/2016,  mammogram and ultrasound 07/07/2016 FINDINGS: Cardiovascular: Pulmonary arteries are well opacified by contrast bolus. There is no acute pulmonary embolus. Thoracic aorta is normal in appearance. Heart size is normal. No pericardial effusion or significant coronary artery calcification. Mediastinum/Nodes: The visualized portion of the thyroid gland has a normal appearance. Small hiatal hernia. Otherwise the esophagus is normal. No mediastinal, hilar, or axillary adenopathy. Lungs/Pleura: Lungs are clear. No pleural effusion or pneumothorax. Upper Abdomen: No acute abnormality. Musculoskeletal: An oval circumscribed mass is identified within the RIGHT breast, consistent with cyst, now measuring 3.8 x 5.2 centimeters. No acute or significant osseous findings. Within the UPPER abdominal wall, there is subcutaneous edema consistent with recent procedure. Review of the MIP images confirms the above findings. IMPRESSION: 1. No evidence for acute pulmonary embolus. 2. Small hiatal hernia. 3. No pulmonary edema. 4. Benign RIGHT breast cyst. 5. Postoperative edema in the UPPER abdominal subcutaneous fat. Electronically Signed   By: Nolon Nations M.D.   On: 12/02/2017 18:41    Microbiology: No results found for this or any previous visit (from the past 240 hour(s)).   Labs: Basic Metabolic Panel: Recent Labs  Lab 12/02/17 1033 12/03/17 0910  NA 139 138  K 3.7 3.4*  CL 103 106  CO2 27 25  GLUCOSE  109* 130*  BUN 11 8  CREATININE 0.55 0.65  CALCIUM 8.4* 7.8*   Liver Function Tests: Recent Labs  Lab 12/03/17 0910  AST 34  ALT 35  ALKPHOS 45  BILITOT 0.9  PROT 5.3*  ALBUMIN 2.3*   No results for input(s): LIPASE, AMYLASE in the last 168 hours. No results for input(s): AMMONIA in the last 168 hours. CBC: Recent Labs  Lab 12/02/17 1033 12/02/17 1946 12/03/17 0023 12/03/17 0910  WBC 10.6*  --  11.0* 9.4  NEUTROABS 6.6  --   --   --   HGB 8.6* 7.3* 7.6* 7.6*  HCT 25.8* 22.5* 23.2* 23.4*   MCV 87.5  --  89.6 89.3  PLT 415*  --  368 390   Cardiac Enzymes: Recent Labs  Lab 12/03/17 0023  TROPONINI <0.03   BNP: BNP (last 3 results) No results for input(s): BNP in the last 8760 hours.  ProBNP (last 3 results) No results for input(s): PROBNP in the last 8760 hours.  CBG: No results for input(s): GLUCAP in the last 168 hours.     Signed:  Kayleen Memos, MD [facility] 12/03/2017, 1:29 PM

## 2017-12-05 LAB — FOLATE RBC
Folate, Hemolysate: 331.9 ng/mL
Folate, RBC: 1412 ng/mL (ref >498)
HEMATOCRIT: 23.5 % — AB (ref 34.0–46.6)

## 2017-12-05 LAB — HIV ANTIBODY (ROUTINE TESTING W REFLEX): HIV Screen 4th Generation wRfx: NONREACTIVE

## 2018-07-09 ENCOUNTER — Emergency Department (HOSPITAL_COMMUNITY): Payer: Medicaid Other

## 2018-07-09 ENCOUNTER — Emergency Department (HOSPITAL_COMMUNITY)
Admission: EM | Admit: 2018-07-09 | Discharge: 2018-07-09 | Disposition: A | Discharge status: Home / Self Care | Payer: Medicaid Other | Attending: Emergency Medicine | Admitting: Emergency Medicine

## 2018-07-09 ENCOUNTER — Other Ambulatory Visit: Payer: Self-pay

## 2018-07-09 ENCOUNTER — Encounter (HOSPITAL_COMMUNITY): Payer: Self-pay

## 2018-07-09 DIAGNOSIS — J111 Influenza due to unidentified influenza virus with other respiratory manifestations: Secondary | ICD-10-CM

## 2018-07-09 DIAGNOSIS — R509 Fever, unspecified: Secondary | ICD-10-CM | POA: Diagnosis present

## 2018-07-09 DIAGNOSIS — R69 Illness, unspecified: Secondary | ICD-10-CM

## 2018-07-09 MED ORDER — ALBUTEROL SULFATE (2.5 MG/3ML) 0.083% IN NEBU
5.0000 mg | INHALATION_SOLUTION | Freq: Once | RESPIRATORY_TRACT | Status: AC
  Administered 2018-07-09: 5 mg via RESPIRATORY_TRACT
  Filled 2018-07-09: qty 6

## 2018-07-09 MED ORDER — ACETAMINOPHEN 500 MG PO TABS
1000.0000 mg | ORAL_TABLET | Freq: Once | ORAL | Status: AC
  Administered 2018-07-09: 1000 mg via ORAL
  Filled 2018-07-09: qty 2

## 2018-07-09 NOTE — ED Triage Notes (Signed)
Pt states that she has had a fever since yesterday. Pt states it reached as high as 104.2. Last motrin 800mg  at 1545

## 2018-07-09 NOTE — ED Provider Notes (Signed)
[facility] DEPT Provider Note   CSN: [id number] Arrival date & time: 07/09/18  1636    History   Chief Complaint Chief Complaint  Patient presents with  . Fever    HPI Mackenzie [name] is a 38 y.o. female.     Mackenzie [name] is a 38 y.o. female with a history of migraines, who presents to the emergency department for evaluation of fevers, bodyaches, congestion and cough.  Patient reports that her symptoms started suddenly last night and since then have been constant and worsening.  T-max at home of 104.2, reports she took ibuprofen just prior to arrival.  She has not taken anything else to treat her symptoms.  Reports cough is occasionally productive and she has had some associated chest tightness but no persistent chest pain or shortness of breath.  She reports a mild sore throat, nasal congestion and rhinorrhea.  She has had generalized body aches.  No rash.  No abdominal pain, nausea or vomiting.  Denies any other aggravating or alleviating factors.  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     Past Medical History:  Diagnosis Date  . Keratoconus of both eyes 2016  . Migraines     Patient Active Problem List   Diagnosis Date Noted  . Anemia 12/02/2017  . Tachycardia 12/02/2017  . Nausea & vomiting 12/02/2017  . Migraines   . Migraine without aura and without status migrainosus, not intractable 07/02/2014  . Blurred vision 05/09/2014  . Keratosis 05/09/2014    Past Surgical History:  Procedure Laterality Date  . ABDOMINAL HYSTERECTOMY    . ANKLE SURGERY     2 screws in right ankle  . CHOLECYSTECTOMY    . HERNIA REPAIR     2012 and July 2015  . LIPOSUCTION  11/27/2017  . UTERINE FIBROID EMBOLIZATION       OB History   No obstetric history on file.      Home Medications    Prior to Admission medications   Medication Sig Start Date End Date Taking? Authorizing Provider  acetaminophen  (TYLENOL) 500 MG tablet Take 1,000 mg by mouth every 6 (six) hours as needed for moderate pain.    [provider]  ciprofloxacin (CIPRO) 500 MG tablet TAKE ONE TABLET BY MOUTH EVERY 12 HOURS FOR 10 DAYS 11/27/17   [provider]  clindamycin (CLEOCIN) 300 MG capsule TAKE ONE CAPSULE BY MOUTH EVERY 12 HOURS FOR 10 DAYS 11/27/17   [provider]  ferrous sulfate 325 (65 FE) MG tablet Take 1 tablet (325 mg total) by mouth daily with breakfast. 12/03/17   Kayleen Memos, DO    Family History Family History  Problem Relation Age of Onset  . Healthy Mother   . Diabetes Maternal Grandmother     Social History Social History   Tobacco Use  . Smoking status: Never Smoker  . Smokeless tobacco: Never Used  Substance Use Topics  . Alcohol use: No    Alcohol/week: 0.0 standard drinks  . Drug use: No     Allergies   Patient has no known allergies.   Review of Systems Review of Systems  Constitutional: Positive for chills and fever.  HENT: Positive for congestion, postnasal drip, rhinorrhea and sore throat. Negative for ear pain.   Respiratory: Positive for cough and chest tightness. Negative for shortness of breath.   Cardiovascular: Negative for chest pain.  Gastrointestinal: Negative for abdominal pain, nausea  and vomiting.  Genitourinary: Negative for dysuria and frequency.  Musculoskeletal: Positive for myalgias. Negative for arthralgias and neck pain.  Skin: Negative for color change and rash.  Neurological: Negative for headaches.     Physical Exam Updated Vital Signs BP 103/71   Pulse (!) 108   Temp 100 F (37.8 C) (Oral)   Resp 18   Ht 5\' 6"  (1.676 m)   Wt 86.6 kg   LMP 01/10/2012   SpO2 98%   BMI 30.83 kg/m   Physical Exam Vitals signs and nursing note reviewed.  Constitutional:      General: She is not in acute distress.    Appearance: Normal appearance. She is well-developed and normal weight. She is not ill-appearing or diaphoretic.   HENT:     Head: Normocephalic and atraumatic.     Right Ear: Tympanic membrane and ear canal normal.     Left Ear: Tympanic membrane and ear canal normal.     Nose: Congestion and rhinorrhea present.     Comments: Bilateral nares patent with moderate mucosal edema and clear rhinorrhea present.     Mouth/Throat:     Mouth: Mucous membranes are moist.     Pharynx: Oropharynx is clear. Posterior oropharyngeal erythema present.     Comments: Posterior oropharynx clear and mucous membranes moist, there is mild erythema but no edema or tonsillar exudates, uvula midline, normal phonation, no trismus, tolerating secretions without difficulty. Eyes:     General:        Right eye: No discharge.        Left eye: No discharge.  Neck:     Musculoskeletal: Neck supple.     Comments: No rigidity Cardiovascular:     Rate and Rhythm: Normal rate and regular rhythm.     Pulses: Normal pulses.     Heart sounds: Normal heart sounds. No murmur. No friction rub. No gallop.   Pulmonary:     Effort: Pulmonary effort is normal. No respiratory distress.     Breath sounds: Normal breath sounds.     Comments: Respirations equal and unlabored, patient able to speak in full sentences, lungs clear to auscultation bilaterally Abdominal:     General: Bowel sounds are normal. There is no distension.     Palpations: Abdomen is soft. There is no mass.     Tenderness: There is no abdominal tenderness. There is no guarding.     Comments: Abdomen soft, nondistended, nontender to palpation in all quadrants without guarding or peritoneal signs  Musculoskeletal:        General: No deformity.  Lymphadenopathy:     Cervical: No cervical adenopathy.  Skin:    General: Skin is warm and dry.     Capillary Refill: Capillary refill takes less than 2 seconds.  Neurological:     Mental Status: She is alert and oriented to person, place, and time.  Psychiatric:        Mood and Affect: Mood normal.        Behavior: Behavior  normal.      ED Treatments / Results  Labs (all labs ordered are listed, but only abnormal results are displayed) Labs Reviewed - No data to display  EKG None  Radiology Dg Chest 2 View  Result Date: 07/09/2018 CLINICAL DATA:  Cough EXAM: CHEST - 2 VIEW COMPARISON:  06/14/2016 FINDINGS: The heart size and mediastinal contours are within normal limits. Both lungs are clear. The visualized skeletal structures are unremarkable. IMPRESSION: No active cardiopulmonary disease. Electronically  Signed   By: Donavan Foil M.D.   On: 07/09/2018 19:04    Procedures Procedures (including critical care time)  Medications Ordered in ED Medications  albuterol (PROVENTIL) (2.5 MG/3ML) 0.083% nebulizer solution 5 mg (5 mg Nebulization Given 07/09/18 1824)  acetaminophen (TYLENOL) tablet 1,000 mg (1,000 mg Oral Given 07/09/18 1824)     Initial Impression / Assessment and Plan / ED Course  I have reviewed the triage vital signs and the nursing notes.  Pertinent labs & imaging results that were available during my care of the patient were reviewed by me and considered in my medical decision making (see chart for details).  Patient with symptoms consistent with influenza.  Vitals are stable, low-grade fever. Improved with medication.  No signs of dehydration, tolerating PO's.  Lungs are clear. CXR without signs of pneumonia, or other active cardiopulmonary disease. Pt within 48 hour window, discussed cost vs. benefit of tamiflu, pt declines tamiflu.  Patient will be discharged with instructions to orally hydrate, rest, and use over-the-counter medications such as anti-inflammatories ibuprofen and Aleve for muscle aches and Tylenol for fever.  PCP follow-up encouraged.  Return precautions discussed.  Patient expresses understanding and agreement with plan.  Final Clinical Impressions(s) / ED Diagnoses   Final diagnoses:  Influenza-like illness    ED Discharge Orders    None       Janet Berlin 07/15/18 Karrie Meres, MD 07/16/18 1007

## 2018-07-09 NOTE — Discharge Instructions (Addendum)
You have the flu this is a viral infection that will likely start to improve after 5-7 days, antibiotics are not helpful in treating viral infections. Please make sure you are drinking plenty of fluids. You can treat your symptoms supportively with tylenol/ibuprofen for fevers and pains, Zyrtec and Flonase to heal with nasal congestion, and over the counter cough syrups and throat lozenges to help with cough. If your symptoms are not improving please follow up with you Primary doctor.   If you develop persistent fevers, shortness of breath or difficulty breathing, chest pain, severe headache and neck pain, persistent nausea and vomiting or other new or concerning symptoms return to the Emergency department.

## 2019-03-25 ENCOUNTER — Other Ambulatory Visit: Payer: Self-pay

## 2019-03-25 DIAGNOSIS — Z20822 Contact with and (suspected) exposure to covid-19: Secondary | ICD-10-CM

## 2019-03-27 LAB — NOVEL CORONAVIRUS, NAA: SARS-CoV-2, NAA: NOT DETECTED

## 2019-06-21 ENCOUNTER — Other Ambulatory Visit: Payer: Self-pay | Admitting: Specialist

## 2019-06-21 DIAGNOSIS — M545 Low back pain, unspecified: Secondary | ICD-10-CM

## 2019-06-25 ENCOUNTER — Other Ambulatory Visit: Payer: Medicaid Other

## 2019-07-01 ENCOUNTER — Ambulatory Visit
Admission: RE | Admit: 2019-07-01 | Discharge: 2019-07-01 | Disposition: A | Discharge status: Home / Self Care | Payer: Medicaid Other | Source: Ambulatory Visit | Attending: Specialist | Admitting: Specialist

## 2019-07-01 DIAGNOSIS — M545 Low back pain, unspecified: Secondary | ICD-10-CM

## 2020-05-01 ENCOUNTER — Emergency Department (HOSPITAL_COMMUNITY)
Admission: EM | Admit: 2020-05-01 | Discharge: 2020-05-01 | Disposition: A | Discharge status: Home / Self Care | Payer: Medicaid Other | Attending: Emergency Medicine | Admitting: Emergency Medicine

## 2020-05-01 ENCOUNTER — Emergency Department (HOSPITAL_COMMUNITY): Payer: Medicaid Other

## 2020-05-01 ENCOUNTER — Other Ambulatory Visit: Payer: Self-pay

## 2020-05-01 ENCOUNTER — Encounter (HOSPITAL_COMMUNITY): Payer: Self-pay

## 2020-05-01 DIAGNOSIS — R0602 Shortness of breath: Secondary | ICD-10-CM | POA: Insufficient documentation

## 2020-05-01 LAB — CBC WITH DIFFERENTIAL/PLATELET
Abs Immature Granulocytes: 0.03 10*3/uL (ref 0.00–0.07)
Basophils Absolute: 0 10*3/uL (ref 0.0–0.1)
Basophils Relative: 0 %
Eosinophils Absolute: 0.3 10*3/uL (ref 0.0–0.5)
Eosinophils Relative: 3 %
HCT: 30.2 % — ABNORMAL LOW (ref 36.0–46.0)
Hemoglobin: 9.6 g/dL — ABNORMAL LOW (ref 12.0–15.0)
Immature Granulocytes: 0 %
Lymphocytes Relative: 29 %
Lymphs Abs: 2.9 10*3/uL (ref 0.7–4.0)
MCH: 29.3 pg (ref 26.0–34.0)
MCHC: 31.8 g/dL (ref 30.0–36.0)
MCV: 92.1 fL (ref 80.0–100.0)
Monocytes Absolute: 0.7 10*3/uL (ref 0.1–1.0)
Monocytes Relative: 7 %
Neutro Abs: 5.9 10*3/uL (ref 1.7–7.7)
Neutrophils Relative %: 61 %
Platelets: 329 10*3/uL (ref 150–400)
RBC: 3.28 MIL/uL — ABNORMAL LOW (ref 3.87–5.11)
RDW: 13.5 % (ref 11.5–15.5)
WBC: 9.8 10*3/uL (ref 4.0–10.5)
nRBC: 0 % (ref 0.0–0.2)

## 2020-05-01 LAB — URINALYSIS, ROUTINE W REFLEX MICROSCOPIC
Bilirubin Urine: NEGATIVE
Glucose, UA: NEGATIVE mg/dL
Hgb urine dipstick: NEGATIVE
Ketones, ur: NEGATIVE mg/dL
Leukocytes,Ua: NEGATIVE
Nitrite: NEGATIVE
Protein, ur: NEGATIVE mg/dL
Specific Gravity, Urine: 1.006 (ref 1.005–1.030)
pH: 8 (ref 5.0–8.0)

## 2020-05-01 LAB — COMPREHENSIVE METABOLIC PANEL
ALT: 12 U/L (ref 0–44)
AST: 12 U/L — ABNORMAL LOW (ref 15–41)
Albumin: 3.5 g/dL (ref 3.5–5.0)
Alkaline Phosphatase: 53 U/L (ref 38–126)
Anion gap: 9 (ref 5–15)
BUN: 5 mg/dL — ABNORMAL LOW (ref 6–20)
CO2: 26 mmol/L (ref 22–32)
Calcium: 8.3 mg/dL — ABNORMAL LOW (ref 8.9–10.3)
Chloride: 104 mmol/L (ref 98–111)
Creatinine, Ser: 0.7 mg/dL (ref 0.44–1.00)
GFR, Estimated: >60 mL/min (ref >60)
Glucose, Bld: 98 mg/dL (ref 70–99)
Potassium: 3.6 mmol/L (ref 3.5–5.1)
Sodium: 139 mmol/L (ref 135–145)
Total Bilirubin: 0.2 mg/dL — ABNORMAL LOW (ref 0.3–1.2)
Total Protein: 6.8 g/dL (ref 6.5–8.1)

## 2020-05-01 LAB — PREGNANCY, URINE: Preg Test, Ur: NEGATIVE

## 2020-05-01 MED ORDER — IOHEXOL 350 MG/ML SOLN
100.0000 mL | Freq: Once | INTRAVENOUS | Status: AC | PRN
  Administered 2020-05-01: 100 mL via INTRAVENOUS

## 2020-05-01 MED ORDER — ONDANSETRON 4 MG PO TBDP
4.0000 mg | ORAL_TABLET | Freq: Once | ORAL | Status: AC
  Administered 2020-05-01: 4 mg via ORAL
  Filled 2020-05-01: qty 1

## 2020-05-01 MED ORDER — SODIUM CHLORIDE 0.9 % IV BOLUS
1000.0000 mL | Freq: Once | INTRAVENOUS | Status: AC
  Administered 2020-05-01: 1000 mL via INTRAVENOUS

## 2020-05-01 MED ORDER — SODIUM CHLORIDE (PF) 0.9 % IJ SOLN
INTRAMUSCULAR | Status: AC
  Filled 2020-05-01: qty 50

## 2020-05-01 MED ORDER — MORPHINE SULFATE (PF) 4 MG/ML IV SOLN
4.0000 mg | Freq: Once | INTRAVENOUS | Status: AC
  Administered 2020-05-01: 4 mg via INTRAVENOUS
  Filled 2020-05-01: qty 1

## 2020-05-01 NOTE — Discharge Instructions (Addendum)
We discussed the results of your CT imaging. You may attempt to massage the right breast in order to help with fluid collection.   If you experience any fever,chest pain, worsening symptoms please return to the ED.

## 2020-05-01 NOTE — ED Notes (Signed)
Attached patient to external female catheter

## 2020-05-01 NOTE — ED Notes (Signed)
Pt discharged from this ED in stable condition at this time. All discharge instructions and follow up care reviewed with pt with no further questions at this time. Pt ambulatory with steady gait, clear speech.  

## 2020-05-01 NOTE — ED Triage Notes (Signed)
Pt presents to ED from home c/o shortness of breath starting this morning after waking. Denies pain. Pt had breast reduction surgery on Tuesday with no complications. Feels like she can't get a deep breath "not going all the way down or all the way up. No hx of asthma or lung problems. Not on birth control.
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
## 2021-09-24 ENCOUNTER — Other Ambulatory Visit: Payer: Self-pay

## 2021-09-24 ENCOUNTER — Encounter (HOSPITAL_COMMUNITY): Payer: Self-pay | Admitting: Emergency Medicine

## 2021-09-24 ENCOUNTER — Emergency Department (HOSPITAL_COMMUNITY)
Admission: EM | Admit: 2021-09-24 | Discharge: 2021-09-25 | Disposition: A | Discharge status: Home / Self Care | Payer: Medicaid Other | Attending: Emergency Medicine | Admitting: Emergency Medicine

## 2021-09-24 ENCOUNTER — Emergency Department (HOSPITAL_COMMUNITY): Payer: Medicaid Other

## 2021-09-24 DIAGNOSIS — R2232 Localized swelling, mass and lump, left upper limb: Secondary | ICD-10-CM | POA: Insufficient documentation

## 2021-09-24 DIAGNOSIS — M542 Cervicalgia: Secondary | ICD-10-CM | POA: Insufficient documentation

## 2021-09-24 DIAGNOSIS — M79632 Pain in left forearm: Secondary | ICD-10-CM | POA: Diagnosis not present

## 2021-09-24 DIAGNOSIS — Y92410 Unspecified street and highway as the place of occurrence of the external cause: Secondary | ICD-10-CM | POA: Insufficient documentation

## 2021-09-24 DIAGNOSIS — R0789 Other chest pain: Secondary | ICD-10-CM | POA: Diagnosis not present

## 2021-09-24 LAB — ETHANOL: Alcohol, Ethyl (B): <10 mg/dL (ref <10)

## 2021-09-24 MED ORDER — CYCLOBENZAPRINE HCL 10 MG PO TABS
10.0000 mg | ORAL_TABLET | Freq: Once | ORAL | Status: AC
  Administered 2021-09-25: 10 mg via ORAL
  Filled 2021-09-24: qty 1

## 2021-09-24 MED ORDER — KETOROLAC TROMETHAMINE 60 MG/2ML IM SOLN
15.0000 mg | Freq: Once | INTRAMUSCULAR | Status: AC
  Administered 2021-09-24: 15 mg via INTRAMUSCULAR
  Filled 2021-09-24: qty 2
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
## 2021-09-24 NOTE — ED Notes (Signed)
Provider notified that the patient is requesting an update at this time and that the patient is ready to go (via secure chat) ?
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
## 2021-09-25 ENCOUNTER — Emergency Department (HOSPITAL_COMMUNITY): Payer: Medicaid Other

## 2021-09-25 MED ORDER — DIPHENHYDRAMINE HCL 50 MG/ML IJ SOLN
25.0000 mg | Freq: Once | INTRAMUSCULAR | Status: AC
  Administered 2021-09-25: 25 mg via INTRAVENOUS
  Filled 2021-09-25: qty 1

## 2021-09-25 MED ORDER — CYCLOBENZAPRINE HCL 10 MG PO TABS: 10.0000 mg | ORAL_TABLET | Freq: Two times a day (BID) | ORAL | 0 refills | Status: DC | PRN

## 2021-09-25 MED ORDER — METHOCARBAMOL 500 MG PO TABS: 500.0000 mg | ORAL_TABLET | Freq: Two times a day (BID) | ORAL | 0 refills | Status: DC

## 2021-09-25 MED ORDER — METHYLPREDNISOLONE SODIUM SUCC 125 MG IJ SOLR
125.0000 mg | Freq: Once | INTRAMUSCULAR | Status: AC
  Administered 2021-09-25: 125 mg via INTRAVENOUS
  Filled 2021-09-25: qty 2
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
## 2021-09-25 NOTE — ED Provider Notes (Addendum)
Assumed care at change of shift, 41 yo female pending CT chest for pain reproducible with palpation.  ?Physical Exam  ?BP 121/89   Pulse 66   Temp 99.3 ?F (37.4 ?C) (Oral)   Resp 19   LMP 01/10/2012   SpO2 99%  ? ?Physical Exam ? ?Procedures  ?Marland KitchenCritical Care ?Performed by: Tacy Learn, PA-C ?Authorized by: Tacy Learn, PA-C  ? ?Critical care provider statement:  ?  Critical care time (minutes):  30 ?  Critical care was time spent personally by me on the following activities:  Development of treatment plan with patient or surrogate, discussions with consultants, evaluation of patient's response to treatment, examination of patient, ordering and review of laboratory studies, ordering and review of radiographic studies, ordering and performing treatments and interventions, pulse oximetry, re-evaluation of patient's condition and review of old charts ? ?ED Course / MDM  ?  ?Medical Decision Making ?Amount and/or Complexity of Data Reviewed ?Labs: ordered. ?Radiology: ordered. ? ?Risk ?Prescription drug management. ? ? ?Patient was given Flexeril, shortly afterwards began to complain of mouth itching and feeling like her throat was closing up.  Patient states that this is happened before and is because she has a shellfish allergy and has the cardiac monitor leads on her chest, states the adhesive has shellfish in it (according to a surgeon after she had breast surgery and a similar reaction).  Patient is tolerating secretions, no audible wheezing, oropharynx appears clear.  Patient was given IV Benadryl and Solu-Medrol with resolution of her symptoms.  Patient is requesting discharge at this time and does not desire any further monitoring.  Patient is discharged with prescription for Robaxin, previous prescription for Flexeril as discarded. ? ? ? ? ?  ?Tacy Learn, PA-C ?09/25/21 0209 ? ?  ?Tacy Learn, PA-C ?09/25/21 0210 ? ?  ?Quintella Reichert, MD ?09/26/21 1259 ? ?

## 2021-09-25 NOTE — ED Notes (Signed)
Pt verbalized understanding of d/c instructions, meds, and followup care. Denies questions. VSS, no distress noted. Steady gait to exit with all belongings.  ?

## 2023-01-26 ENCOUNTER — Other Ambulatory Visit: Payer: Self-pay

## 2023-01-26 ENCOUNTER — Encounter (HOSPITAL_COMMUNITY): Payer: Self-pay

## 2023-01-26 ENCOUNTER — Emergency Department (HOSPITAL_COMMUNITY): Payer: Medicaid Other

## 2023-01-26 ENCOUNTER — Emergency Department (HOSPITAL_COMMUNITY)
Admission: EM | Admit: 2023-01-26 | Discharge: 2023-01-26 | Disposition: A | Discharge status: Home / Self Care | Payer: Medicaid Other

## 2023-01-26 DIAGNOSIS — M25532 Pain in left wrist: Secondary | ICD-10-CM | POA: Insufficient documentation

## 2023-01-26 MED ORDER — NAPROXEN 500 MG PO TABS: 500.0000 mg | ORAL_TABLET | Freq: Two times a day (BID) | ORAL | 0 refills | Status: AC

## 2023-01-26 MED ORDER — NAPROXEN 500 MG PO TABS
500.0000 mg | ORAL_TABLET | Freq: Once | ORAL | Status: AC
  Administered 2023-01-26: 500 mg via ORAL
  Filled 2023-01-26: qty 1

## 2023-01-26 NOTE — ED Provider Notes (Signed)
Mackenzie [name]   CSN: [id number] Arrival date & time: 01/26/23  2017     History  Chief Complaint  Patient presents with   Arm Pain    Mackenzie [name] is a 42 y.o. female.  42 y.o female with no PMH presents to the ED with a chief complaint of left wrist pain which began approximately this morning.  Reports she went to sleep last night felt that she was starting to get some pain.  Upon waking up this morning felt she could not range her left wrist due to pain.  She states that she feels like there is a sharp wrist.  She does not have any prior history of gout, denies any alcohol intake does eat a lot of meat.  She has not had any fevers.  Prior intervention to her left hand including some cyst removals along with a prior diagnosis of carpal tunnel.  She has not taken any medication for improvement in symptoms.No fever, no trauma, or other complaints.   The history is provided by the patient.  Arm Pain       Home Medications Prior to Admission medications   Medication Sig Start Date End Date Taking? Authorizing Provider  acetaminophen (TYLENOL) 500 MG tablet Take 1,000 mg by mouth every 6 (six) hours as needed for moderate pain.    [provider]  ciprofloxacin (CIPRO) 500 MG tablet TAKE ONE TABLET BY MOUTH EVERY 12 HOURS FOR 10 DAYS 11/27/17   [provider]  clindamycin (CLEOCIN) 300 MG capsule TAKE ONE CAPSULE BY MOUTH EVERY 12 HOURS FOR 10 DAYS 11/27/17   [provider]  ferrous sulfate 325 (65 FE) MG tablet Take 1 tablet (325 mg total) by mouth daily with breakfast. 12/03/17   Darlin Drop, DO  methocarbamol (ROBAXIN) 500 MG tablet Take 1 tablet (500 mg total) by mouth 2 (two) times daily. 09/25/21   Jeannie Fend, PA-C      Allergies    Patient has no known allergies.    Review of Systems   Review of Systems  Constitutional:  Negative for fever.  Musculoskeletal:  Positive for arthralgias.     Physical Exam Updated Vital Signs BP 131/81 (BP Location: Left Arm)   Pulse 92   Temp 98.3 F (36.8 C) (Oral)   Resp 15   Ht 5\' 6"  (1.676 m)   Wt 86.2 kg   LMP 01/10/2012   SpO2 98%   BMI 30.67 kg/m  Physical Exam Vitals and nursing [name] reviewed.  Constitutional:      Appearance: Normal appearance.  HENT:     Head: Normocephalic and atraumatic.     Mouth/Throat:     Mouth: Mucous membranes are moist.  Cardiovascular:     Rate and Rhythm: Normal rate.     Pulses:          Radial pulses are 2+ on the left side.  Pulmonary:     Effort: Pulmonary effort is normal.  Abdominal:     General: Abdomen is flat.  Musculoskeletal:        General: Tenderness present.     Left wrist: Tenderness present. No swelling, deformity, lacerations, bony tenderness or snuff box tenderness. Normal pulse.     Cervical back: Normal range of motion and neck supple.     Comments: Limited range of motion due to pain.  Has good flexion and extension although slower.  No snuffbox tenderness, normal pulses.  No  obvious deformity noted.  Skin:    General: Skin is warm and dry.  Neurological:     Mental Status: She is alert and oriented to person, place, and time.     ED Results / Procedures / Treatments   Labs (all labs ordered are listed, but only abnormal results are displayed) Labs Reviewed - No data to display  EKG None  Radiology No results found.  Procedures Procedures    Medications Ordered in ED Medications - No data to display  ED Course/ Medical Decision Making/ A&P                                 Medical Decision Making Amount and/or Complexity of Data Reviewed Radiology: ordered.  Risk Prescription drug management.   Patient presents to the ED with a chief complaint of left wrist pain which began this morning upon waking up.  Describing it as a cramping shooting sensation to her left hand exacerbated with any type of flexion and extension.  Has applied ice to  the area, heat without any improvement in symptoms.  She did not take any medication for improvement in her symptoms.  Arrived to the ED afebrile, no prior history of IV drug use, no prior history of intervention to her left wrist aside from some removal of some superficial cyst.  On evaluation she is neurovascularly intact with good flexion and extension of the wrist.  Capillary refill is intact and no tingling sensation reported.  X-ray obtained for further evaluation of occult fracture versus dislocation although unlikely. X-ray of the left hand did not show any dislocation, no fracture, there is no snuff box tenderness on exam.  We discussed symptomatic treatment at home with NSAIDs, RICE therapy.  She will follow-up with orthopedic surgery if symptoms do not improve.  Patient is hemodynamically stable for discharge.     Portions of this [name] were generated with Scientist, clinical (histocompatibility and immunogenetics). Dictation errors may occur despite best attempts at proofreading.   Final Clinical Impression(s) / ED Diagnoses Final diagnoses:  Left wrist pain    Rx / DC Orders ED Discharge Orders     None         Claude Manges, PA-C 01/26/23 2147    Coral Spikes, DO 01/27/23 0019

## 2023-01-26 NOTE — Discharge Instructions (Signed)
You are prescribed a short course of anti-inflammatories, please take 1 tablet with food twice a day for the next 7 days.  You are also given the number to the hand specialist on-call, please schedule an appointment with him here symptoms do not improve.

## 2023-01-26 NOTE — ED Triage Notes (Signed)
Nontraumatic left forearm/ wrist pain beginning this morning.

## 2023-05-08 ENCOUNTER — Other Ambulatory Visit (HOSPITAL_COMMUNITY): Payer: Self-pay

## 2023-05-08 MED ORDER — WEGOVY 1 MG/0.5ML ~~LOC~~ SOAJ
0.5000 mL | SUBCUTANEOUS | 0 refills | Status: DC
  Filled 2023-05-08: qty 2, 28d supply, fill #0

## 2023-05-08 MED ORDER — WEGOVY 1.7 MG/0.75ML ~~LOC~~ SOAJ
0.7500 mL | SUBCUTANEOUS | 0 refills | Status: DC
  Filled 2023-05-08: qty 3, 28d supply, fill #0

## 2023-05-09 ENCOUNTER — Other Ambulatory Visit (HOSPITAL_COMMUNITY): Payer: Self-pay

## 2023-06-30 ENCOUNTER — Other Ambulatory Visit (HOSPITAL_COMMUNITY): Payer: Self-pay

## 2023-06-30 MED ORDER — WEGOVY 1.7 MG/0.75ML ~~LOC~~ SOAJ: 1.7000 mg | SUBCUTANEOUS | 0 refills | Status: DC

## 2023-06-30 MED ORDER — ONDANSETRON 8 MG PO TBDP
8.0000 mg | ORAL_TABLET | Freq: Three times a day (TID) | ORAL | 0 refills | Status: DC | PRN
  Filled 2023-06-30: qty 60, 20d supply, fill #0

## 2023-06-30 MED ORDER — WEGOVY 1 MG/0.5ML ~~LOC~~ SOAJ
1.0000 mg | SUBCUTANEOUS | 0 refills | Status: DC
  Filled 2023-06-30: qty 2, 28d supply, fill #0

## 2023-07-14 ENCOUNTER — Encounter: Payer: Self-pay | Admitting: Nurse Practitioner

## 2023-07-25 ENCOUNTER — Encounter: Payer: Self-pay | Admitting: Gastroenterology

## 2023-07-25 ENCOUNTER — Ambulatory Visit (INDEPENDENT_AMBULATORY_CARE_PROVIDER_SITE_OTHER): Payer: Medicaid Other | Admitting: Gastroenterology

## 2023-07-25 VITALS — BP 118/78 | HR 65

## 2023-07-25 DIAGNOSIS — K5900 Constipation, unspecified: Secondary | ICD-10-CM

## 2023-07-25 DIAGNOSIS — K5901 Slow transit constipation: Secondary | ICD-10-CM

## 2023-07-25 DIAGNOSIS — R1310 Dysphagia, unspecified: Secondary | ICD-10-CM | POA: Diagnosis not present

## 2023-07-25 MED ORDER — IBSRELA 50 MG PO TABS: 50.0000 mg | ORAL_TABLET | Freq: Two times a day (BID) | ORAL | 0 refills | Status: DC

## 2023-07-25 NOTE — Patient Instructions (Addendum)
 Please purchase the following medications over the counter and take as directed:  Mineral oil enemas x 2  BOWEL PURGE:   Purchase 1 (one) 119 GRAM bottle of Miralax Purchase 1 (one) 32 ounce bottle of Gatorade   STEPS:   Mix the entire bottle of Miralax in the 32 ounces of room temperature Gatorade and stir to dissolve completely. Drink the Miralax solution you have prepared. You will drink this mixture over the next 2-3 hours.  You should expect results within 1 to 6 hours after completing this bowel purge.  YOU MAY NEED TO REPEAT THIS IF NO OUTPUT WITHIN 12 - 18 HOURS  After you have had output from the enemas and Miralax,  you can try the samples of IBSrela we are giving you today. Take twice a day.  If this is helpful we can send you a prescription to take ongoing.  If you have worsening abdominal pain or are unable to achieve output with the enemas and Miralax/Gatorade bowel purge, you should go the the Emergency Room.  Please send [place] an update via MyChart in a few days. ____________________________________________________  Mackenzie [name] have been scheduled for an endoscopy. Please follow written instructions given to you at your visit today.  If you use inhalers (even only as needed), please bring them with you on the day of your procedure.  If you take any of the following medications, they will need to be adjusted prior to your procedure:   DO NOT TAKE 7 DAYS PRIOR TO TEST- Trulicity (dulaglutide) Ozempic, Wegovy (semaglutide) Mounjaro (tirzepatide) Bydureon Bcise (exanatide extended release)  DO NOT TAKE 1 DAY PRIOR TO YOUR TEST Rybelsus (semaglutide) Adlyxin (lixisenatide) Victoza (liraglutide) Byetta (exanatide) ___________________________________________________________________________     Thank you for entrusting me with your care and for choosing [facility], Dr. Ileene Patrick

## 2023-07-25 NOTE — Progress Notes (Signed)
 HPI :  43 year old female with chronic constipation, dysphagia, migraine headaches, here for new patient visit for chronic constipation and dysphagia.  She was previously followed by [facility] in 2022 for these issues, looking to reestablish her care with a new practice.  She endorses having constipation for many years.  At baseline if she does not take anything she can have a bowel movement on average anywhere from once every 1 to 2 weeks or so.  She states she has gone as long as 4 to 6 weeks before without having a bowel movement.  She has mainly tried over-the-counter products in the past such as MiraLAX, Dulcolax, stool softeners, other laxatives at [facility].  She has had some variable success with these but long-term they have not provided good benefit for her.  I can see in her notes from [facility] that she was tried on Linzess previously which apparently did not work too well either.  More recently in 2022 she was given Amitiza.  She states when she took it reliably she would have a bowel movement every 3 days or so.  She was still having some constipation but at least moving her bowels regularly.  She states she ran out of this a few months ago.  She contacted her prior practice to get in and was told she cannot be seen until July and did not get refills and sought care elsewhere.  She states she currently has had worsening constipation over the past few months.  She sits and strains significantly to get stool out.  Her last bowel movement she thinks was about 6 weeks ago.  This is much longer than her normal course in between bowel movements.  She feels her abdomen is distended and has had discomfort due to lack of stooling.  No blood in her stools, no output at all during this time.  She was actually traveling in [place] last month, few weeks ago was seen in the ED, given a laxative and an enema but reported no output at that time.  She was told to follow-up with her GI physician.  She did  states she has tried some MiraLAX and drank a bottle over the course of a week or 2 in recent weeks but it did not do anything, she also endorsed some vomiting when drinking higher dosing of MiraLAX.  I see in her chart she was given iron pills in the past, she states she has not been on these in years and does not take iron.  I do see that she has been on Wegovy, she states since November but denies that that made her bowels any worse at all, she states her bowels are moving at baseline before and after starting that.  She does not smoke any tobacco, does not use any illicit drugs, does not use any alcohol.  Otherwise the other main issue that bothers her is dysphagia.  She states she has dysphagia to certain solids where she can feel it get lodged in her throat or in her lower chest and drinks water to push things through.  She states this has become progressive over time and is happening almost on a daily basis at this point.  She has no dysphagia with just liquids or some soft foods.  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  She states that completely  resolved her dysphagia for some time until it is recurred over the past several months.  She denies any reflux otherwise.  She otherwise denies any cardiopulmonary symptoms.  No issues with anesthesia in the past.  She appears comfortable in the office today.  Prior workup.  EGD 04/07/21: [facility] Findings  Benign-appearing intrinsic stricture (traversable) with length of 4 cm and  diameter of 14 mm in the GE junction; performed 8 cold forceps biopsies;  dilated with Maloney dilator from 52 Fr starting size to 54 Fr ending size  with step size of 2 Fr. Dilation caused improved passage of the scope and  improved lumen appearance. The dilators passed without any appreciable  resistance. Heme was present only on the 54 FR dilator after it was   removed so no further dilators were passed. A second look revealed that  the stricture had been dilated. There was no signs of bleeding or  perforation. The esophagus was otherwise normal.  Performed 8 forceps biopsies to rule out eosinophilic esophagitis in the  upper third of the esophagus, middle third of the esophagus and lower  third of the esophagus  The stomach appeared normal.  Performed 8 forceps biopsies to rule out H. pylori in the cardia, fundus  of the stomach, body of the stomach, greater curve of the stomach, lesser  curve of the stomach, incisura, antrum and prepyloric region  The duodenum appeared normal.  Performed 8 forceps biopsies to rule out celiac disease in the duodenal  bulb, 1st part of the duodenum and 2nd part of the duodenum    Colonoscopy 04/07/21: Adequate prep - internal hemorrhoids, no polyps - told to repeat in 10 years  Final Pathologic Diagnosis   A.  SMALL INTESTINE, DUODENUM, ENDOSCOPIC BIOPSY:              FRAGMENTS OF UNREMARKABLE SMALL INTESTINAL MUCOSA.              NO EVIDENCE OF CELIAC DISEASE.  B.  STOMACH, ENDOSCOPIC BIOPSY:              CHRONIC GASTRITIS.              FEATURES OF HELICOBACTER GASTRITIS NOT IDENTIFIED.  C.  ESOPHAGUS, STRICTURE, ENDOSCOPIC BIOPSY:              FRAGMENTS OF UNREMARKABLE ESOPHAGEAL MUCOSA MIXED WITH              FRAGMENTS OF GASTRIC MUCOSA WITH MILD CHRONIC INFLAMMATION.              NO EVIDENCE OF INTESTINAL METAPLASIA, DYSPLASIA, OR MALIGNANCY.  D.  ESOPHAGUS, RANDOM, ENDOSCOPIC BIOPSY:              FRAGMENTS OF UNREMARKABLE ESOPHAGEAL MUCOSA.              NO EVIDENCE OF EOSINOPHILIC ESOPHAGITIS.      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   Migraines      Past Surgical History:  Procedure Laterality Date   ABDOMINAL HYSTERECTOMY     ANKLE SURGERY     2 screws in right ankle   CHOLECYSTECTOMY     HERNIA REPAIR      2012 and July 2015   LIPOSUCTION  11/27/2017   REDUCTION MAMMAPLASTY     UTERINE FIBROID EMBOLIZATION     Family History  Problem Relation Age of Onset  Healthy Mother    Diabetes Maternal Grandmother    Liver disease Neg Hx    Esophageal cancer Neg Hx    Colon cancer Neg Hx    Social History   Tobacco Use   Smoking status: Never   Smokeless tobacco: Never  Vaping Use   Vaping status: Never Used  Substance Use Topics   Alcohol use: No    Alcohol/week: 0.0 standard drinks of alcohol   Drug use: No   Current Outpatient Medications  Medication Sig Dispense Refill   Semaglutide-Weight Management (WEGOVY) 1 MG/0.5ML SOAJ Inject 1 mg into the skin once a week. 2 mL 0   Tenapanor HCl (IBSRELA) 50 MG TABS Take 50 mg by mouth 2 (two) times daily before a meal. Lot: [id number], Exp: 02-19-25 18 tablet 0   acetaminophen (TYLENOL) 500 MG tablet Take 1,000 mg by mouth every 6 (six) hours as needed for moderate pain. (Patient not taking: Reported on 07/25/2023)     ciprofloxacin (CIPRO) 500 MG tablet TAKE ONE TABLET BY MOUTH EVERY 12 HOURS FOR 10 DAYS (Patient not taking: Reported on 07/25/2023)  0   clindamycin (CLEOCIN) 300 MG capsule TAKE ONE CAPSULE BY MOUTH EVERY 12 HOURS FOR 10 DAYS (Patient not taking: Reported on 07/25/2023)  0   ferrous sulfate 325 (65 FE) MG tablet Take 1 tablet (325 mg total) by mouth daily with breakfast. (Patient not taking: Reported on 07/25/2023) 30 tablet 0   methocarbamol (ROBAXIN) 500 MG tablet Take 1 tablet (500 mg total) by mouth 2 (two) times daily. (Patient not taking: Reported on 07/25/2023) 20 tablet 0   ondansetron (ZOFRAN-ODT) 8 MG disintegrating tablet Take 1 tablet (8 mg total) by mouth every 8 (eight) hours as needed for nausea. (Patient not taking: Reported on 07/25/2023) 60 tablet 0   Semaglutide-Weight Management (WEGOVY) 1 MG/0.5ML SOAJ Inject 1 mg into the skin once a week. (Patient not taking: Reported on 07/25/2023) 2 mL 0   Semaglutide-Weight  Management (WEGOVY) 1.7 MG/0.75ML SOAJ Inject 1.7 mg into the skin once a week. (Patient not taking: Reported on 07/25/2023) 3 mL 0   Semaglutide-Weight Management (WEGOVY) 1.7 MG/0.75ML SOAJ Inject 1.7 mg into the skin once a week. (Patient not taking: Reported on 07/25/2023) 3 mL 0   No current facility-administered medications for this visit.   No Known Allergies   Review of Systems: All systems reviewed and negative except where noted in HPI.    No recent labs on file  Physical Exam: BP 118/78   Pulse 65   LMP 01/10/2012  Constitutional: Pleasant,well-developed, female in no acute distress. HEENT: Normocephalic and atraumatic. Conjunctivae are normal. No scleral icterus. Neck supple.  Cardiovascular: Normal rate, regular rhythm.  Pulmonary/chest: Effort normal and breath sounds normal.  Abdominal: Soft, protuberant, nontender.  There are no masses palpable.  DRE - CMA Lucius Conn as standby - no obvious impaction palpated, soft stool in rectal vault, no mass lesions, normal tone, normal squeeze and decent of pelvic floor - no dyssynergia Extremities: no edema Lymphadenopathy: No cervical adenopathy noted. Neurological: Alert and oriented to person place and time. Skin: Skin is warm and dry. No rashes noted. Psychiatric: Normal mood and affect. Behavior is normal.   ASSESSMENT: 43 y.o. female here for assessment of the following  1. Constipation, unspecified constipation type   2. Dysphagia, unspecified type    Longstanding slow transit constipation.  We discussed her course at length.  DRE today shows no evidence of dyssynergia, I otherwise did not appreciate  any obvious impaction within reach of examination finger.  She has severe baseline constipation which has worsened in recent months, now has not had much output over the past 6 weeks.  She has some abdominal discomfort and distention but otherwise appears comfortable.  We discussed options.  We discussed how Reginal Lutes could  make her constipation worse however she is adamant it has not.  Initially, I would recommend treating her with some enemas to get some distal output first.  Would recommend some mineral oil enemas x 2 at home first to see if this can help soften and relieve some stool.  Next, when she has had some output there she can try some MiraLAX bowel prep.  I recommend slow prep and can repeat if needed.  If she is having some intolerance to this she needs to let us know.  I provided some Ibsrela samples for her to use 50 mg once to twice daily but would prefer to have some stool output first and decompress her abdomen a bit prior to using this.  I counseled her this could make her have severe abdominal cramping if she takes this without having some stool output first.  Longer-term, we will see if Allena Napoleon helps her and if so in the samples work then we can give her a prescription for this.  I counseled her that should she have lack of output with enemas or prep, with worsening abdominal pain, vomiting, etc., she may need to see care in the emergency room for further evaluation.  She understands, hopefully that is not needed.  Otherwise, she has had a distal esophageal stricture treated in the past with dilation with good result in 2022.  She has had some recurrent symptoms recently.  I offered her an EGD at the [facility] with dilation of the distal esophagus.  Discussed risks and benefits and she wishes to proceed.  Will get her constipation addressed first and proceed with EGD when she is ready for that in the upcoming weeks.   PLAN: - Mineral oil enemas x 2 at home first - Miralax bowel prep to follow enemas after she has had some distal stool output, go slow as tolerated and repeat if needed - IBSrela samples given - 1 x 50mg  tab q day to BID after she has had output from Miralax / enemas - if IBSrela works can write prescription - if worsening with abdominal pain / vomiting or no output, may need to go to the ED  /hospitalization for further management - schedule EGD with dilation at the [facility] for dysphagia  [name], MD [facility]  CC: Alm Bustard, MD

## 2023-08-14 ENCOUNTER — Encounter: Payer: Self-pay | Admitting: Certified Registered Nurse Anesthetist

## 2023-08-18 ENCOUNTER — Ambulatory Visit: Admitting: Gastroenterology

## 2023-08-18 ENCOUNTER — Encounter: Payer: Self-pay | Admitting: Gastroenterology

## 2023-08-18 VITALS — BP 109/70 | HR 71 | Temp 98.4°F | Resp 15 | Ht 66.0 in | Wt 200.0 lb

## 2023-08-18 DIAGNOSIS — K449 Diaphragmatic hernia without obstruction or gangrene: Secondary | ICD-10-CM | POA: Diagnosis not present

## 2023-08-18 DIAGNOSIS — K219 Gastro-esophageal reflux disease without esophagitis: Secondary | ICD-10-CM | POA: Diagnosis not present

## 2023-08-18 DIAGNOSIS — R1310 Dysphagia, unspecified: Secondary | ICD-10-CM | POA: Diagnosis not present

## 2023-08-18 DIAGNOSIS — K222 Esophageal obstruction: Secondary | ICD-10-CM | POA: Diagnosis not present

## 2023-08-18 MED ORDER — OMEPRAZOLE 20 MG PO CPDR: 20.0000 mg | DELAYED_RELEASE_CAPSULE | Freq: Every day | ORAL | 1 refills | Status: DC

## 2023-08-18 MED ORDER — SODIUM CHLORIDE 0.9 % IV SOLN: 500.0000 mL | Freq: Once | INTRAVENOUS | Status: DC

## 2023-08-18 NOTE — Progress Notes (Signed)
 Report given to PACU, vss

## 2023-08-18 NOTE — Progress Notes (Signed)
 History and Physical Interval Note: seen on 07/25/23 - here for EGD with dilation for symptoms of dysphagia. She had an esophageal stricture dilated at [facility] in November 2022, symptoms resolved but then have recurred over time. No interval changes since office visit. EGD to further evaluate and dilate if appropriate. Discussed risks / benefits as outlined. She understands and wishes to proceed.   08/18/2023 9:27 AM  Mackenzie [name]  has presented today for endoscopic procedure(s), with the diagnosis of  Encounter Diagnosis  Name Primary?   Dysphagia, unspecified type Yes  .  The various methods of evaluation and treatment have been discussed with the patient and/or family. After consideration of risks, benefits and other options for treatment, the patient has consented to  the endoscopic procedure(s).   The patient's history has been reviewed, patient examined, no change in status, stable for surgery.  I have reviewed the patient's chart and labs.  Questions were answered to the patient's satisfaction.    [name], MD [facility]

## 2023-08-18 NOTE — Progress Notes (Signed)
0933 Robinul 0.1 mg IV given due large amount of secretions upon assessment.  MD made aware, vss 

## 2023-08-18 NOTE — Progress Notes (Signed)
 Called to room to assist during endoscopic procedure.  Patient ID and intended procedure confirmed with present staff. Received instructions for my participation in the procedure from the performing physician.

## 2023-08-18 NOTE — Patient Instructions (Signed)
-   Post dilation diet - Continue present medications. - Start omeprazole 20mg  / day for 4 weeks post dilation and then use PRN for reflux - Await pathology results and course post dilation - Patient given educational handouts related to procedure.  YOU HAD AN ENDOSCOPIC PROCEDURE TODAY AT THE [facility]:   Refer to the procedure report that was given to you for any specific questions about what was found during the examination.  If the procedure report does not answer your questions, please call your gastroenterologist to clarify.  If you requested that your care partner not be given the details of your procedure findings, then the procedure report has been included in a sealed envelope for you to review at your convenience later.  YOU SHOULD EXPECT: Some feelings of bloating in the abdomen. Passage of more gas than usual.  Walking can help get rid of the air that was put into your GI tract during the procedure and reduce the bloating. If you had a lower endoscopy (such as a colonoscopy or flexible sigmoidoscopy) you may notice spotting of blood in your stool or on the toilet paper. If you underwent a bowel prep for your procedure, you may not have a normal bowel movement for a few days.  Please Note:  You might notice some irritation and congestion in your nose or some drainage.  This is from the oxygen used during your procedure.  There is no need for concern and it should clear up in a day or so.  SYMPTOMS TO REPORT IMMEDIATELY: Following upper endoscopy (EGD)  Vomiting of blood or coffee ground material  New chest pain or pain under the shoulder blades  Painful or persistently difficult swallowing  New shortness of breath  Fever of 100F or higher  Black, tarry-looking stools  For urgent or emergent issues, a gastroenterologist can be reached at any hour by calling [phone]. Do not use MyChart messaging for urgent concerns.    DIET:  We do recommend a small meal at  first, but then you may proceed to your regular diet.  Drink plenty of fluids but you should avoid alcoholic beverages for 24 hours.  ACTIVITY:  You should plan to take it easy for the rest of today and you should NOT DRIVE or use heavy machinery until tomorrow (because of the sedation medicines used during the test).    FOLLOW UP: Our staff will call the number listed on your records the next business day following your procedure.  We will call around 7:15- 8:00 am to check on you and address any questions or concerns that you may have regarding the information given to you following your procedure. If we do not reach you, we will leave a message.     If any biopsies were taken you will be contacted by phone or by letter within the next 1-3 weeks.  Please call us at [phone] if you have not heard about the biopsies in 3 weeks.    SIGNATURES/CONFIDENTIALITY: You and/or your care partner have signed paperwork which will be entered into your electronic medical record.  These signatures attest to the fact that that the information above on your After Visit Summary has been reviewed and is understood.  Full responsibility of the confidentiality of this discharge information lies with you and/or your care-partner.

## 2023-08-18 NOTE — Progress Notes (Signed)
 Cell phone off per pt  ?

## 2023-08-18 NOTE — Op Note (Signed)
 [name] Patient Name: Mackenzie [name] Procedure Date: 08/18/2023 9:32 AM MRN: [id number] Endoscopist: Viviann Spare P. Adela Lank , MD, [id number] Age: 43 Referring MD:  Date of Birth: 17-Oct-1980 Gender: Female Account #: [id number] Procedure:                Upper GI endoscopy Indications:              Dysphagia - history of esophageal stricture in 2022                            [name]), biopsies at that time rule out EoE. Medicines:                Monitored Anesthesia Care Procedure:                Pre-Anesthesia Assessment:                           - Prior to the procedure, a History and Physical                            was performed, and patient medications and                            allergies were reviewed. The patient's tolerance of                            previous anesthesia was also reviewed. The risks                            and benefits of the procedure and the sedation                            options and risks were discussed with the patient.                            All questions were answered, and informed consent                            was obtained. Prior Anticoagulants: The patient has                            taken no anticoagulant or antiplatelet agents. ASA                            Grade Assessment: I - A normal, healthy patient.                            After reviewing the risks and benefits, the patient                            was deemed in satisfactory condition to undergo the                            procedure.  After obtaining informed consent, the endoscope was                            passed under direct vision. Throughout the                            procedure, the patient's blood pressure, pulse, and                            oxygen saturations were monitored continuously. The                            Olympus Scope [id number] was introduced through the                            mouth,  and advanced to the second part of duodenum.                            The upper GI endoscopy was accomplished without                            difficulty. The patient tolerated the procedure                            well. Scope In: Scope Out: Findings:                 Esophagogastric landmarks were identified: the                            Z-line was found at 33 cm, the gastroesophageal                            junction was found at 33 cm and the upper extent of                            the gastric folds was found at 37 cm from the                            incisors.                           A 4 cm hiatal hernia was present.                           One benign-appearing, intrinsic stenosis was found                            33 cm from the incisors, at the GEJ. This stenosis                            measured less than one cm (in length). There was  slightly nodularity at one aspect of it I suspect                            inflammatory in etiology. A TTS dilator was passed                            through the scope. Dilation with a 15-16.5-18 mm                            balloon dilator was performed to 15 mm, 16.5 mm and                            18 mm, after which appropriate dilation effect was                            noted. Biopsies were taken with a cold forceps for                            histology and to open the stricture further. .                           The exam of the esophagus was otherwise normal.                           The entire examined stomach was normal.                           The examined duodenum was normal. Complications:            No immediate complications. Estimated blood loss:                            Minimal. Estimated Blood Loss:     Estimated blood loss was minimal. Impression:               - Esophagogastric landmarks identified.                           - 4 cm hiatal hernia.                            - Benign-appearing esophageal stenosis. Dilated to                            18mm. Biopsied.                           - Normal stomach.                           - Normal examined duodenum. Recommendation:           - Patient has a contact number available for                            emergencies. The signs and symptoms of  potential                            delayed complications were discussed with the                            patient. Return to normal activities tomorrow.                            Written discharge instructions were provided to the                            patient.                           - Post dilation diet                           - Continue present medications.                           - Start omeprazole 20mg  / day for 4 weeks post                            dilation and then use PRN for reflux                           - Await pathology results and course post dilation [name], MD 08/18/2023 9:56:23 AM This report has been signed electronically.

## 2023-08-21 ENCOUNTER — Telehealth: Payer: Self-pay | Admitting: *Deleted

## 2023-08-21 NOTE — Telephone Encounter (Signed)
  Follow up Call-     08/18/2023    9:02 AM  Call back number  Post procedure Call Back phone  # [phone]  Permission to leave phone message Yes     Patient questions:  Do you have a fever, pain , or abdominal swelling? No. Pain Score  0 *  Have you tolerated food without any problems? Yes.    Have you been able to return to your normal activities? Yes.    Do you have any questions about your discharge instructions: Diet   No. Medications  No. Follow up visit  No.  Do you have questions or concerns about your Care? No.  Actions: * If pain score is 4 or above: No action needed, pain <4.

## 2023-08-22 LAB — SURGICAL PATHOLOGY

## 2023-08-28 ENCOUNTER — Encounter: Payer: Self-pay | Admitting: Gastroenterology

## 2023-08-28 ENCOUNTER — Ambulatory Visit: Payer: Medicaid Other | Admitting: Nurse Practitioner

## 2023-09-12 ENCOUNTER — Telehealth: Payer: Self-pay | Admitting: Gastroenterology

## 2023-09-12 NOTE — Telephone Encounter (Signed)
 Inbound call from patient, states she is getting cosmetic surgery in May, a tummy tuck and patient would like to know if she can have procedure done being that she has a hiatal hernia. Or if she would have to get it removed prior to her surgery. Patient states she asked her PCP and he advised her to ask Dr. General Kenner.

## 2023-09-12 NOTE — Telephone Encounter (Signed)
 See note below and advise.

## 2023-09-12 NOTE — Telephone Encounter (Signed)
Pt aware.

## 2023-09-12 NOTE — Telephone Encounter (Signed)
 Hiatal hernia would not interfere with this type of surgery, she can proceed as scheduled. Thanks

## 2023-10-08 ENCOUNTER — Emergency Department (HOSPITAL_COMMUNITY)
Admission: EM | Admit: 2023-10-08 | Discharge: 2023-10-08 | Disposition: A | Discharge status: Home / Self Care | Attending: Emergency Medicine | Admitting: Emergency Medicine

## 2023-10-08 ENCOUNTER — Encounter (HOSPITAL_COMMUNITY): Payer: Self-pay

## 2023-10-08 ENCOUNTER — Emergency Department (HOSPITAL_COMMUNITY)

## 2023-10-08 ENCOUNTER — Other Ambulatory Visit: Payer: Self-pay

## 2023-10-08 DIAGNOSIS — R319 Hematuria, unspecified: Secondary | ICD-10-CM | POA: Diagnosis present

## 2023-10-08 DIAGNOSIS — N3001 Acute cystitis with hematuria: Secondary | ICD-10-CM | POA: Insufficient documentation

## 2023-10-08 LAB — COMPREHENSIVE METABOLIC PANEL WITH GFR
ALT: 13 U/L (ref 0–44)
AST: 16 U/L (ref 15–41)
Albumin: 3.7 g/dL (ref 3.5–5.0)
Alkaline Phosphatase: 62 U/L (ref 38–126)
Anion gap: 7 (ref 5–15)
BUN: 9 mg/dL (ref 6–20)
CO2: 23 mmol/L (ref 22–32)
Calcium: 8.7 mg/dL — ABNORMAL LOW (ref 8.9–10.3)
Chloride: 108 mmol/L (ref 98–111)
Creatinine, Ser: 0.62 mg/dL (ref 0.44–1.00)
GFR, Estimated: >60 mL/min (ref >60)
Glucose, Bld: 94 mg/dL (ref 70–99)
Potassium: 4 mmol/L (ref 3.5–5.1)
Sodium: 138 mmol/L (ref 135–145)
Total Bilirubin: 0.8 mg/dL (ref 0.0–1.2)
Total Protein: 7 g/dL (ref 6.5–8.1)

## 2023-10-08 LAB — CBC WITH DIFFERENTIAL/PLATELET
Abs Immature Granulocytes: 0.02 10*3/uL (ref 0.00–0.07)
Basophils Absolute: 0 10*3/uL (ref 0.0–0.1)
Basophils Relative: 0 %
Eosinophils Absolute: 0.2 10*3/uL (ref 0.0–0.5)
Eosinophils Relative: 2 %
HCT: 39.1 % (ref 36.0–46.0)
Hemoglobin: 12.4 g/dL (ref 12.0–15.0)
Immature Granulocytes: 0 %
Lymphocytes Relative: 27 %
Lymphs Abs: 2.2 10*3/uL (ref 0.7–4.0)
MCH: 28.4 pg (ref 26.0–34.0)
MCHC: 31.7 g/dL (ref 30.0–36.0)
MCV: 89.7 fL (ref 80.0–100.0)
Monocytes Absolute: 0.5 10*3/uL (ref 0.1–1.0)
Monocytes Relative: 6 %
Neutro Abs: 5.4 10*3/uL (ref 1.7–7.7)
Neutrophils Relative %: 65 %
Platelets: 318 10*3/uL (ref 150–400)
RBC: 4.36 MIL/uL (ref 3.87–5.11)
RDW: 13.9 % (ref 11.5–15.5)
WBC: 8.3 10*3/uL (ref 4.0–10.5)
nRBC: 0 % (ref 0.0–0.2)

## 2023-10-08 LAB — URINALYSIS, ROUTINE W REFLEX MICROSCOPIC
Bacteria, UA: NONE SEEN
Bilirubin Urine: NEGATIVE
Glucose, UA: NEGATIVE mg/dL
Ketones, ur: NEGATIVE mg/dL
Nitrite: NEGATIVE
Protein, ur: 100 mg/dL — AB
RBC / HPF: >50 RBC/hpf (ref 0–5)
Specific Gravity, Urine: 1.019 (ref 1.005–1.030)
WBC, UA: >50 WBC/hpf (ref 0–5)
pH: 6 (ref 5.0–8.0)

## 2023-10-08 MED ORDER — CEPHALEXIN 500 MG PO CAPS: 500.0000 mg | ORAL_CAPSULE | Freq: Four times a day (QID) | ORAL | 0 refills | Status: DC

## 2023-10-08 MED ORDER — ACETAMINOPHEN 500 MG PO TABS
1000.0000 mg | ORAL_TABLET | Freq: Once | ORAL | Status: AC
  Administered 2023-10-08: 1000 mg via ORAL
  Filled 2023-10-08: qty 2

## 2023-10-08 MED ORDER — SODIUM CHLORIDE 0.9 % IV BOLUS
500.0000 mL | Freq: Once | INTRAVENOUS | Status: AC
  Administered 2023-10-08: 500 mL via INTRAVENOUS

## 2023-10-08 MED ORDER — CEFPODOXIME PROXETIL 200 MG PO TABS
200.0000 mg | ORAL_TABLET | Freq: Two times a day (BID) | ORAL | 0 refills | Status: DC
Start: 2023-10-08 — End: 2023-10-08

## 2023-10-08 NOTE — ED Provider Notes (Signed)
 [place] EMERGENCY DEPARTMENT AT [facility] Provider Note   CSN: [id number] Arrival date & time: 10/08/23  4098     History Chief Complaint  Patient presents with   Vaginal Bleeding    Mackenzie [name] is a 43 y.o. female with self reported h/o overactive bladder currently seeing specialist, presents to the ER today for evaluation of hematuria/vaginal bleeding (?) The patient reports that she had a hysterectomy in 2013.  She reports that she usually wakes up frequently throughout the night to urinate.  This has been a known problem for years which is why she sees urology.  Was on medication however stopped taking it in April given that she ran out of her trial pack.  Does have new a coming appointment with them.  She did not felt this medication helped her any.  The patient reports that she woke up and was feeling more pressure cramping in her very lower abdomen and started noticed that she was having some hematuria.  She reports that since being here she has urinated twice in the last episode did not have any blood in the urine or any blood when she wiped.  She denies any rectal pain.  Denies any nausea, vomiting, diarrhea, constipation, fever, or flank pain.  Denies any lower back pain.  Describes it as a cramping sensation to the lower suprapubic area.  She denies any recent vaginal penetration.  Denies any melena or hematochezia.  Denies any vaginal pain.  She reports that she has had symptoms like this previously where they thought that she may have passed a kidney stone or had a UTI.   Vaginal Bleeding Associated symptoms: abdominal pain and dysuria   Associated symptoms: no fever, no nausea and no vaginal discharge        Home Medications Prior to Admission medications   Medication Sig Start Date End Date Taking? Authorizing Provider  cefpodoxime  (VANTIN ) 200 MG tablet Take 1 tablet (200 mg total) by mouth 2 (two) times daily for 7 days. 10/08/23 10/15/23 Yes Spence Dux,  PA-C  acetaminophen  (TYLENOL ) 500 MG tablet Take 1,000 mg by mouth every 6 (six) hours as needed for moderate pain. Patient not taking: Reported on 07/25/2023    [provider]  clindamycin  (CLEOCIN ) 300 MG capsule TAKE ONE CAPSULE BY MOUTH EVERY 12 HOURS FOR 10 DAYS Patient not taking: Reported on 07/25/2023 11/27/17   [provider]  ferrous sulfate  325 (65 FE) MG tablet Take 1 tablet (325 mg total) by mouth daily with breakfast. Patient not taking: Reported on 07/25/2023 12/03/17   Bary Boss, DO  methocarbamol  (ROBAXIN ) 500 MG tablet Take 1 tablet (500 mg total) by mouth 2 (two) times daily. Patient not taking: Reported on 08/18/2023 09/25/21   Darlis Eisenmenger, PA-C  omeprazole  (PRILOSEC) 20 MG capsule Take 1 capsule (20 mg total) by mouth daily. 08/18/23   Armbruster, Lendon Queen, MD  ondansetron  (ZOFRAN -ODT) 8 MG disintegrating tablet Take 1 tablet (8 mg total) by mouth every 8 (eight) hours as needed for nausea. Patient not taking: Reported on 08/18/2023 06/30/23     Semaglutide -Weight Management (WEGOVY ) 1 MG/0.5ML SOAJ Inject 1 mg into the skin once a week. 05/08/23     Semaglutide -Weight Management (WEGOVY ) 1 MG/0.5ML SOAJ Inject 1 mg into the skin once a week. Patient not taking: Reported on 08/18/2023 06/30/23     Semaglutide -Weight Management (WEGOVY ) 1.7 MG/0.75ML SOAJ Inject 1.7 mg into the skin once a week. Patient not taking: Reported on  08/18/2023 05/08/23     Semaglutide -Weight Management (WEGOVY ) 1.7 MG/0.75ML SOAJ Inject 1.7 mg into the skin once a week. Patient not taking: Reported on 07/25/2023 06/30/23     Tenapanor HCl (IBSRELA ) 50 MG TABS Take 50 mg by mouth 2 (two) times daily before a meal. Lot: [id number], Exp: 02-19-25 Patient not taking: Reported on 08/18/2023 07/25/23   Armbruster, Lendon Queen, MD      Allergies    Other and Shellfish allergy    Review of Systems   Review of Systems  Constitutional:  Negative for chills and fever.  Respiratory:  Negative for shortness  of breath.   Cardiovascular:  Negative for chest pain.  Gastrointestinal:  Positive for abdominal pain. Negative for blood in stool, constipation, diarrhea, nausea and vomiting.  Genitourinary:  Positive for dysuria, frequency, hematuria and urgency. Negative for difficulty urinating, flank pain, pelvic pain and vaginal discharge.    Physical Exam Updated Vital Signs BP 108/64 (BP Location: Left Arm)   Pulse 66   Temp 98.5 F (36.9 C) (Oral)   Resp 18   LMP 01/10/2012   SpO2 99%  Physical Exam Vitals and nursing note reviewed. Exam conducted with a chaperone present Gwendloyn Lemming, RN).  Constitutional:      General: She is not in acute distress.    Appearance: She is not ill-appearing or toxic-appearing.  HENT:     Mouth/Throat:     Mouth: Mucous membranes are moist.  Cardiovascular:     Rate and Rhythm: Normal rate.  Abdominal:     Palpations: Abdomen is soft.     Tenderness: There is abdominal tenderness. There is no right CVA tenderness, left CVA tenderness, guarding or rebound.     Comments: Mild tenderness at the very lower suprapubic area without guarding or rebound.  Feels need for urination with palpation.  Soft.  No guarding or rebound.  No CVA tenderness to palpation.  Genitourinary:    Comments: GU examination unremarkable.  There is no bleeding coming from the vaginal cuff.  Appears intact.  Urethra appears unremarkable. Musculoskeletal:     Right lower leg: No edema.     Left lower leg: No edema.  Skin:    General: Skin is warm and dry.  Neurological:     Mental Status: She is alert.     ED Results / Procedures / Treatments   Labs (all labs ordered are listed, but only abnormal results are displayed) Labs Reviewed  COMPREHENSIVE METABOLIC PANEL WITH GFR - Abnormal; Notable for the following components:      Result Value   Calcium 8.7 (*)    All other components within normal limits  URINALYSIS, ROUTINE W REFLEX MICROSCOPIC - Abnormal; Notable for the following  components:   APPearance CLOUDY (*)    Hgb urine dipstick LARGE (*)    Protein, ur 100 (*)    Leukocytes,Ua LARGE (*)    Non Squamous Epithelial 0-5 (*)    All other components within normal limits  URINE CULTURE  CBC WITH DIFFERENTIAL/PLATELET    EKG None  Radiology CT Renal Stone Study Result Date: 10/08/2023 CLINICAL DATA:  Abdominal/flank pain, stone suspected Pt states she woke up this morning and was having some abdominal pain and feeling like she had to go to the bathroom a lot and found blood in the toilet. Pt now having vaginal bleeding. EXAM: CT ABDOMEN AND PELVIS WITHOUT CONTRAST TECHNIQUE: Multidetector CT imaging of the abdomen and pelvis was performed following the standard protocol without IV contrast.  RADIATION DOSE REDUCTION: This exam was performed according to the departmental dose-optimization program which includes automated exposure control, adjustment of the mA and/or kV according to patient size and/or use of iterative reconstruction technique. COMPARISON:  CT abdomen pelvis 12/02/2017 FINDINGS: Lower chest: Tiny hiatal hernia. Hepatobiliary: No focal liver abnormality. Status post cholecystectomy. No biliary dilatation. Pancreas: No focal lesion. Normal pancreatic contour. No surrounding inflammatory changes. No main pancreatic ductal dilatation. Spleen: Normal in size without focal abnormality. Adrenals/Urinary Tract: No adrenal nodule bilaterally. No nephrolithiasis and no hydronephrosis. No definite contour-deforming renal mass. No ureterolithiasis or hydroureter. The urinary bladder is unremarkable. Stomach/Bowel: Stomach is within normal limits. No evidence of bowel wall thickening or dilatation. Appendix appears normal. Vascular/Lymphatic: No abdominal aorta or iliac aneurysm. No abdominal, pelvic, or inguinal lymphadenopathy. Reproductive: Status post hysterectomy. No adnexal masses. Other: No intraperitoneal free fluid. No intraperitoneal free gas. No organized fluid  collection. Musculoskeletal: No abdominal wall hernia or abnormality. Subcutaneus soft tissue edema likely related to surgical changes along the abdominal wall. No suspicious lytic or blastic osseous lesions. No acute displaced fracture. IMPRESSION: 1. No acute intra-abdominal or intrapelvic abnormality with limited evaluation on this noncontrast study. 2. Status post hysterectomy and cholecystectomy. Electronically Signed   By: Morgane  Naveau M.D.   On: 10/08/2023 12:41    Procedures Procedures   Medications Ordered in ED Medications  sodium chloride  0.9 % bolus 500 mL (0 mLs Intravenous Stopped 10/08/23 1320)  acetaminophen  (TYLENOL ) tablet 1,000 mg (1,000 mg Oral Given 10/08/23 1256)    ED Course/ Medical Decision Making/ A&P   Medical Decision Making Amount and/or Complexity of Data Reviewed Labs: ordered. Radiology: ordered.  Risk OTC drugs. Prescription drug management.   43 y.o. female presents to the ER for evaluation of hematuria. Differential diagnosis includes but is not limited to cystitis, urinary calculi, renal cell carcinoma, bladder cancer, glomerulonephritis, polycystic kidneys, anticoagulant usage, interstitial nephritis, menses, atrophic vaginitis. Vital signs unremarkable. Physical exam as noted above.   On previous chart evaluation, see patient's been seen for mixed incontinence with urology with [facility].  Was on Myrbetriq.  Again, reports the urgency and frequency is not abnormal for her and has been suffering with this for years.  Will continue with CT renal and lab work with urinalysis.  Likely needs more hemorrhagic cystitis or UTI.  Pelvic examination does not reveal any vaginal bleeding, given the patient's state of her urine I think this is hematuria.  I independently reviewed and interpreted the patient's labs.  CBC with a leukocytosis or anemia.  CMP shows mild decrease To 8.7 otherwise no electrolyte or LFT abnormality.  Urinalysis is cloudy urine with large  amount hemoglobin.  There is 100 protein present.  Large amount leukocytes with greater than 50 white blood cells and red blood cells.  No bacteria present however there are white blood cell clumps.  Will culture.  From reading patient's previous culture reports, she has grown E. coli.  Will treat given this.  CT renal  1. No acute intra-abdominal or intrapelvic abnormality with limited evaluation on this noncontrast study. 2. Status post hysterectomy and cholecystectomy. Per radiologist's interpretation.    CT does not show any stones.  Again, think this is likely some cystitis.  Patient reports that she has urinated multiple times being here and has not had any recurrence of hematuria.  She also could have recently passed a stone however again I think this is likely cystitis.  Will treat her with cefpodoxime .  Recommended following with  her PCP and urologist.  Instructed on oral hydration.  She is hemodynamically stable, and is not ill-appearing.  She stable for discharge home.  We discussed the results of the labs/imaging. The plan is antibiotics, hydration. We discussed strict return precautions and red flag symptoms. The patient verbalized their understanding and agrees to the plan. The patient is stable and being discharged home in good condition.  Addendum: Patient's insurance would not cover the cefpodoxime , I have canceled this and sent her in Keflex .  Portions of this report may have been transcribed using voice recognition software. Every effort was made to ensure accuracy; however, inadvertent computerized transcription errors may be present.   Final Clinical Impression(s) / ED Diagnoses Final diagnoses:  Acute cystitis with hematuria    Rx / DC Orders ED Discharge Orders          Ordered    cefpodoxime  (VANTIN ) 200 MG tablet  2 times daily,   Status:  Discontinued        10/08/23 1319    cephALEXin  (KEFLEX ) 500 MG capsule  4 times daily        10/08/23 1455               Spence Dux, PA-C 10/08/23 1611    Flonnie Humphrey, DO 10/18/23 2352

## 2023-10-08 NOTE — ED Triage Notes (Signed)
 Pt states she woke up this morning and was having some abdominal pain and feeling like she had to go to the bathroom a lot and found blood in the toilet. Pt now having vaginal bleeding.

## 2023-10-08 NOTE — Discharge Instructions (Addendum)
 You were seen in the ER today for evaluation of your lower abdominal cramping and pain when your urinate. Your labs are consistent with a urinary tract infection. You will need to make sure that you are staying well hydrated, drinking plenty of fluids, mainly water. You will need to follow up with your urologist. Please make sure you call to schedule an appointment. If you start to have flank pain, fever, nausea, vomiting, worsening belly pain, heavy bleeding, return to the ER. If you have any other concerns, new or worsening symptoms, please return to the ER for re-evaluation.   Contact a health care provider if: Your symptoms don't get better after 1-2 days of taking antibiotics. Your symptoms go away and then come back. You have a fever or chills. You vomit or feel like you may vomit. Get help right away if: You have very bad pain in your back or lower belly. You faint.

## 2023-10-09 LAB — URINE CULTURE: Culture: NO GROWTH
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
## 2024-02-04 ENCOUNTER — Other Ambulatory Visit: Payer: Self-pay

## 2024-02-04 ENCOUNTER — Emergency Department (HOSPITAL_COMMUNITY)
Admission: EM | Admit: 2024-02-04 | Discharge: 2024-02-04 | Disposition: A | Discharge status: Home / Self Care | Attending: Emergency Medicine | Admitting: Emergency Medicine

## 2024-02-04 ENCOUNTER — Emergency Department (HOSPITAL_COMMUNITY)

## 2024-02-04 DIAGNOSIS — M25511 Pain in right shoulder: Secondary | ICD-10-CM | POA: Diagnosis present

## 2024-02-04 DIAGNOSIS — M19011 Primary osteoarthritis, right shoulder: Secondary | ICD-10-CM | POA: Diagnosis not present

## 2024-02-04 MED ORDER — KETOROLAC TROMETHAMINE 15 MG/ML IJ SOLN
15.0000 mg | Freq: Once | INTRAMUSCULAR | Status: AC
  Administered 2024-02-04: 15 mg via INTRAMUSCULAR
  Filled 2024-02-04: qty 1

## 2024-02-04 MED ORDER — METHOCARBAMOL 500 MG PO TABS
500.0000 mg | ORAL_TABLET | Freq: Once | ORAL | Status: AC
  Administered 2024-02-04: 500 mg via ORAL
  Filled 2024-02-04: qty 1

## 2024-02-04 MED ORDER — METHOCARBAMOL 500 MG PO TABS: 500.0000 mg | ORAL_TABLET | Freq: Two times a day (BID) | ORAL | 0 refills | Status: DC

## 2024-02-04 NOTE — Discharge Instructions (Addendum)
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
## 2024-02-04 NOTE — ED Provider Notes (Signed)
 [place] EMERGENCY DEPARTMENT AT [facility] Provider Note   CSN: [id number] Arrival date & time: 02/04/24  9185     Patient presents with: Shoulder Pain   Mackenzie [name] is a 43 y.o. female with overall noncontributory past medical history who presents with concern for right shoulder pain radiating to elbow.  She reports that she cannot lift the right arm without significant difficulty.  She denies any known injury.  She denies any history of similar.  She has not taken anything for pain at home.    Shoulder Pain      Prior to Admission medications   Medication Sig Start Date End Date Taking? Authorizing Provider  methocarbamol  (ROBAXIN ) 500 MG tablet Take 1 tablet (500 mg total) by mouth 2 (two) times daily. 02/04/24  Yes Elizzie Westergard H, PA-C  acetaminophen  (TYLENOL ) 500 MG tablet Take 1,000 mg by mouth every 6 (six) hours as needed for moderate pain. Patient not taking: Reported on 07/25/2023    [provider]  cephALEXin  (KEFLEX ) 500 MG capsule Take 1 capsule (500 mg total) by mouth 4 (four) times daily. 10/08/23   Bernis Ernst, PA-C  clindamycin  (CLEOCIN ) 300 MG capsule TAKE ONE CAPSULE BY MOUTH EVERY 12 HOURS FOR 10 DAYS Patient not taking: Reported on 07/25/2023 11/27/17   [provider]  ferrous sulfate  325 (65 FE) MG tablet Take 1 tablet (325 mg total) by mouth daily with breakfast. Patient not taking: Reported on 07/25/2023 12/03/17   Shona Terry SAILOR, DO  omeprazole  (PRILOSEC) 20 MG capsule Take 1 capsule (20 mg total) by mouth daily. 08/18/23   Armbruster, Elspeth SQUIBB, MD  ondansetron  (ZOFRAN -ODT) 8 MG disintegrating tablet Take 1 tablet (8 mg total) by mouth every 8 (eight) hours as needed for nausea. Patient not taking: Reported on 08/18/2023 06/30/23     Semaglutide -Weight Management (WEGOVY ) 1 MG/0.5ML SOAJ Inject 1 mg into the skin once a week. 05/08/23     Semaglutide -Weight Management (WEGOVY ) 1 MG/0.5ML SOAJ Inject 1 mg into the skin once a  week. Patient not taking: Reported on 08/18/2023 06/30/23     Semaglutide -Weight Management (WEGOVY ) 1.7 MG/0.75ML SOAJ Inject 1.7 mg into the skin once a week. Patient not taking: Reported on 08/18/2023 05/08/23     Semaglutide -Weight Management (WEGOVY ) 1.7 MG/0.75ML SOAJ Inject 1.7 mg into the skin once a week. Patient not taking: Reported on 07/25/2023 06/30/23     Tenapanor HCl (IBSRELA ) 50 MG TABS Take 50 mg by mouth 2 (two) times daily before a meal. Lot: [id number], Exp: 02-19-25 Patient not taking: Reported on 08/18/2023 07/25/23   Armbruster, Elspeth SQUIBB, MD    Allergies: Other and Shellfish allergy    Review of Systems  All other systems reviewed and are negative.   Updated Vital Signs BP 122/72 (BP Location: Left Arm)   Pulse 90   Temp 98.7 F (37.1 C) (Oral)   Resp 17   Ht 5' 6 (1.676 m)   Wt 85.7 kg   LMP 01/10/2012   SpO2 100%   BMI 30.51 kg/m   Physical Exam Vitals and nursing note reviewed.  Constitutional:      General: She is not in acute distress.    Appearance: Normal appearance.  HENT:     Head: Normocephalic and atraumatic.  Eyes:     General:        Right eye: No discharge.        Left eye: No discharge.  Cardiovascular:     Rate and  Rhythm: Normal rate and regular rhythm.     Pulses: Normal pulses.  Pulmonary:     Effort: Pulmonary effort is normal. No respiratory distress.  Musculoskeletal:        General: No deformity.     Comments: Some tenderness to palpation of the right shoulder extending from the trapezius on the right, through the lateral humeral head, decreased range of motion to flexion, extension, abduction past around 45 degrees from abdomen.  No soft tissue swelling, no joint effusion cruciated.  Skin:    General: Skin is warm and dry.  Neurological:     Mental Status: She is alert and oriented to person, place, and time.  Psychiatric:        Mood and Affect: Mood normal.        Behavior: Behavior normal.     (all labs ordered are  listed, but only abnormal results are displayed) Labs Reviewed - No data to display  EKG: None  Radiology: DG Shoulder Right Result Date: 02/04/2024 CLINICAL DATA:  43 year old female with right shoulder pain. No known injury. EXAM: RIGHT SHOULDER - 2+ VIEW COMPARISON:  Right shoulder series 12/16/2013. FINDINGS: Three views. No glenohumeral joint dislocation. Proximal right humerus and scapula appear intact. Intact right clavicle. Mild but progressed right AC joint degenerative spurring since 2015. No acute osseous abnormality identified. Negative visible right ribs and chest. IMPRESSION: Mild right AC joint degeneration. No acute osseous abnormality identified about the right shoulder. Electronically Signed   By: VEAR Hurst M.D.   On: 02/04/2024 09:56     Procedures   Medications Ordered in the ED  ketorolac  (TORADOL ) 15 MG/ML injection 15 mg (15 mg Intramuscular Given 02/04/24 0924)  methocarbamol  (ROBAXIN ) tablet 500 mg (500 mg Oral Given 02/04/24 9076)                                    Medical Decision Making Amount and/or Complexity of Data Reviewed Radiology: ordered.  Risk Prescription drug management.   This patient is a 43 y.o. female who presents to the ED for concern of shoulder pain with no injury, trauma.   Differential diagnoses prior to evaluation: Fracture, dislocation, frozen shoulder, arthritis, strain, sprain, versus other  Past Medical History / Social History / Additional history: Chart reviewed. Pertinent results include: Overall noncontributory  Physical Exam: Physical exam performed. The pertinent findings include: Some tenderness to palpation of the right shoulder extending from the trapezius on the right, through the lateral humeral head, decreased range of motion to flexion, extension, abduction past around 45 degrees from abdomen.  No soft tissue swelling, no joint effusion cruciated.   Radial, ulnar pulses are 2+ in the affected right upper  extremity  Medications / Treatment: Administered Toradol , Robaxin  in the emergency department, patient reports some improvement, placed in sling, I independently interpreted plain film radiograph which shows some degenerative changes of the right AC joint, no effusion, no fracture, dislocation.   Disposition: After consideration of the diagnostic results and the patients response to treatment, I feel that possible rotator cuff injury, encourage close follow-up with orthopedics, given prescription for muscle relaxant, encouraged ibuprofen , Tylenol , sling as needed.SABRA   emergency department workup does not suggest an emergent condition requiring admission or immediate intervention beyond what has been performed at this time. The plan is:as above. The patient is safe for discharge and has been instructed to return immediately for worsening symptoms, change in  symptoms or any other concerns.   Final diagnoses:  Acute pain of right shoulder  Arthritis of right shoulder    ED Discharge Orders          Ordered    methocarbamol  (ROBAXIN ) 500 MG tablet  2 times daily        02/04/24 1028               Jerrell Mangel, [place], PA-C 02/04/24 1033    Francesca Elsie CROME, MD 02/04/24 1541

## 2024-04-17 NOTE — H&P (Signed)
 PREOPERATIVE H&P  Chief Complaint: OA RIGHT SHOULDER  HPI: Mackenzie [name] is a 43 y.o. female who presents with a diagnosis of OA RIGHT SHOULDER. Symptoms are rated as moderate to severe, and have been worsening.  This is significantly impairing activities of daily living.  She has elected for surgical management.   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   Migraines    Past Surgical History:  Procedure Laterality Date   ABDOMINAL HYSTERECTOMY     ANKLE SURGERY     2 screws in right ankle   CHOLECYSTECTOMY     HERNIA REPAIR     2012 and July 2015   LIPOSUCTION  11/27/2017   REDUCTION MAMMAPLASTY     UTERINE FIBROID EMBOLIZATION     Social History   Socioeconomic History   Marital status: Married    Spouse name: Not on file   Number of children: 2   Years of education: AS   Highest education level: Not on file  Occupational History   Not on file  Tobacco Use   Smoking status: Never   Smokeless tobacco: Never  Vaping Use   Vaping status: Never Used  Substance and Sexual Activity   Alcohol use: No    Alcohol/week: 0.0 standard drinks of alcohol   Drug use: No   Sexual activity: Yes    Birth control/protection: Surgical  Other Topics Concern   Not on file  Social History Narrative   Patient is single.   Patient works full time .   Education college.   Right handed.   Caffeine  none         Social Drivers of Corporate Investment Banker Strain: Low Risk  (06/27/2023)   Received from [facility]   Overall Financial Resource Strain (CARDIA)    Difficulty of Paying Living Expenses: Not hard at all  Food Insecurity: Low Risk  (08/03/2023)   Received from [facility]   Hunger Vital Sign    Within the past 12 months, you worried that your food would run out before you got money to buy more: Never true    Within the past 12 months, the food you bought just didn't last and you didn't have  money to get more. : Never true  Transportation Needs: No Transportation Needs (08/03/2023)   Received from [facility]    In the past 12 months, has lack of reliable transportation kept you from medical appointments, meetings, work or from getting things needed for daily living? : No  Physical Activity: Unknown (06/27/2023)   Received from [facility]   Exercise Vital Sign    On average, how many days per week do you engage in moderate to strenuous exercise (like a brisk walk)?: 0 days    Minutes of Exercise per Session: Not on file  Stress: No Stress Concern Present (06/27/2023)   Received from [facility] - Occupational Stress Questionnaire    Feeling of Stress : Not at all  Social Connections: Socially Integrated (06/27/2023)   Received from [facility]   Social Network    How would you rate your social network (family, work, friends)?: Good participation with social networks   Family History  Problem Relation Age of Onset   Healthy Mother    Diabetes Maternal Grandmother    Liver disease Neg Hx    Esophageal  cancer Neg Hx    Colon cancer Neg Hx    Stomach cancer Neg Hx    Rectal cancer Neg Hx    Allergies  Allergen Reactions   Other Rash    Mayo   Shellfish Allergy Hives   Prior to Admission medications   Medication Sig Start Date End Date Taking? Authorizing Provider  acetaminophen  (TYLENOL ) 500 MG tablet Take 1,000 mg by mouth every 6 (six) hours as needed for moderate pain. Patient not taking: Reported on 07/25/2023    [provider]  cephALEXin  (KEFLEX ) 500 MG capsule Take 1 capsule (500 mg total) by mouth 4 (four) times daily. 10/08/23   Bernis Ernst, PA-C  clindamycin  (CLEOCIN ) 300 MG capsule TAKE ONE CAPSULE BY MOUTH EVERY 12 HOURS FOR 10 DAYS Patient not taking: Reported on 07/25/2023 11/27/17   [provider]  ferrous sulfate  325 (65 FE) MG tablet Take 1 tablet (325 mg total) by mouth  daily with breakfast. Patient not taking: Reported on 07/25/2023 12/03/17   Shona Terry SAILOR, DO  methocarbamol  (ROBAXIN ) 500 MG tablet Take 1 tablet (500 mg total) by mouth 2 (two) times daily. 02/04/24   Prosperi, Christian H, PA-C  omeprazole  (PRILOSEC) 20 MG capsule Take 1 capsule (20 mg total) by mouth daily. 08/18/23   Armbruster, Elspeth SQUIBB, MD  ondansetron  (ZOFRAN -ODT) 8 MG disintegrating tablet Take 1 tablet (8 mg total) by mouth every 8 (eight) hours as needed for nausea. Patient not taking: Reported on 08/18/2023 06/30/23     Semaglutide -Weight Management (WEGOVY ) 1 MG/0.5ML SOAJ Inject 1 mg into the skin once a week. 05/08/23     Semaglutide -Weight Management (WEGOVY ) 1 MG/0.5ML SOAJ Inject 1 mg into the skin once a week. Patient not taking: Reported on 08/18/2023 06/30/23     Semaglutide -Weight Management (WEGOVY ) 1.7 MG/0.75ML SOAJ Inject 1.7 mg into the skin once a week. Patient not taking: Reported on 08/18/2023 05/08/23     Semaglutide -Weight Management (WEGOVY ) 1.7 MG/0.75ML SOAJ Inject 1.7 mg into the skin once a week. Patient not taking: Reported on 07/25/2023 06/30/23     Tenapanor HCl (IBSRELA ) 50 MG TABS Take 50 mg by mouth 2 (two) times daily before a meal. Lot: [id number], Exp: 02-19-25 Patient not taking: Reported on 08/18/2023 07/25/23   Armbruster, Elspeth SQUIBB, MD     Positive ROS: All other systems have been reviewed and were otherwise negative with the exception of those mentioned in the HPI and as above.  Physical Exam: General: Alert, no acute distress Cardiovascular: No pedal edema Respiratory: No cyanosis, no use of accessory musculature GI: No organomegaly, abdomen is soft and non-tender Skin: No lesions in the area of chief complaint Neurologic: Sensation intact distally Psychiatric: Patient is competent for consent with normal mood and affect Lymphatic: No axillary or cervical lymphadenopathy  MUSCULOSKELETAL: ***   Imaging:   Assessment: OA RIGHT SHOULDER  Plan: Plan  for Procedure(s): EXCISION, CLAVICLE, DISTAL, OPEN  The risks benefits and alternatives were discussed with the patient including but not limited to the risks of nonoperative treatment, versus surgical intervention including infection, bleeding, nerve injury,  blood clots, cardiopulmonary complications, morbidity, mortality, among others, and they were willing to proceed.   Weightbearing: Orthopedic devices: Showering: Dressing: Medicines:  Discharge: Follow up:    Gerard CHRISTELLA Ted DEVONNA Office [phone] 04/17/2024 4:57 PM

## 2024-04-22 ENCOUNTER — Other Ambulatory Visit (HOSPITAL_COMMUNITY): Payer: Self-pay

## 2024-04-22 MED ORDER — PHENTERMINE HCL 37.5 MG PO TABS
37.5000 mg | ORAL_TABLET | Freq: Every morning | ORAL | 1 refills | Status: DC
  Filled 2024-04-22: qty 30, 30d supply, fill #0

## 2024-04-23 ENCOUNTER — Encounter (HOSPITAL_BASED_OUTPATIENT_CLINIC_OR_DEPARTMENT_OTHER): Payer: Self-pay | Admitting: Orthopedic Surgery

## 2024-04-23 ENCOUNTER — Other Ambulatory Visit: Payer: Self-pay

## 2024-04-29 NOTE — Anesthesia Preprocedure Evaluation (Addendum)
 Anesthesia Evaluation  Patient identified by MRN, date of birth, ID band Patient awake    Reviewed: Allergy & Precautions, NPO status , Patient's Chart, lab work & pertinent test results  History of Anesthesia Complications Negative for: history of anesthetic complications  Airway Mallampati: II  TM Distance: >3 FB Neck ROM: Full    Dental no notable dental hx. (+) Teeth Intact, Dental Advisory Given   Pulmonary neg pulmonary ROS   Pulmonary exam normal breath sounds clear to auscultation       Cardiovascular (-) hypertension(-) Past MI negative cardio ROS Normal cardiovascular exam Rhythm:Regular Rate:Normal     Neuro/Psych  Headaches  negative psych ROS   GI/Hepatic Neg liver ROS,GERD  Controlled and Medicated,,  Endo/Other  neg diabetes    Renal/GU negative Renal ROS     Musculoskeletal   Abdominal   Peds  Hematology   Anesthesia Other Findings   Reproductive/Obstetrics                              Anesthesia Physical Anesthesia Plan  ASA: 2  Anesthesia Plan: General   Post-op Pain Management: Regional block*   Induction: Intravenous  PONV Risk Score and Plan: Midazolam , Treatment may vary due to age or medical condition and Propofol  infusion  Airway Management Planned: Oral ETT  Additional Equipment: None  Intra-op Plan:   Post-operative Plan: Extubation in OR  Informed Consent: I have reviewed the patients History and Physical, chart, labs and discussed the procedure including the risks, benefits and alternatives for the proposed anesthesia with the patient or authorized representative who has indicated his/her understanding and acceptance.     Dental advisory given  Plan Discussed with: CRNA and Surgeon  Anesthesia Plan Comments: (R ISB)         Anesthesia Quick Evaluation

## 2024-04-30 ENCOUNTER — Encounter (HOSPITAL_BASED_OUTPATIENT_CLINIC_OR_DEPARTMENT_OTHER): Admission: RE | Disposition: A | Payer: Self-pay | Source: Home / Self Care | Attending: Orthopedic Surgery

## 2024-04-30 ENCOUNTER — Ambulatory Visit (HOSPITAL_BASED_OUTPATIENT_CLINIC_OR_DEPARTMENT_OTHER)
Admission: RE | Admit: 2024-04-30 | Discharge: 2024-04-30 | Disposition: A | Attending: Orthopedic Surgery | Admitting: Orthopedic Surgery

## 2024-04-30 ENCOUNTER — Encounter (HOSPITAL_BASED_OUTPATIENT_CLINIC_OR_DEPARTMENT_OTHER): Payer: Self-pay | Admitting: Orthopedic Surgery

## 2024-04-30 ENCOUNTER — Ambulatory Visit (HOSPITAL_BASED_OUTPATIENT_CLINIC_OR_DEPARTMENT_OTHER): Payer: Self-pay | Admitting: Anesthesiology

## 2024-04-30 ENCOUNTER — Other Ambulatory Visit (HOSPITAL_COMMUNITY): Payer: Self-pay

## 2024-04-30 ENCOUNTER — Encounter (HOSPITAL_BASED_OUTPATIENT_CLINIC_OR_DEPARTMENT_OTHER): Payer: Self-pay | Admitting: Anesthesiology

## 2024-04-30 HISTORY — DX: Gastro-esophageal reflux disease without esophagitis: K21.9

## 2024-04-30 HISTORY — PX: RESECTION DISTAL CLAVICAL: SHX5053

## 2024-04-30 SURGERY — EXCISION, CLAVICLE, DISTAL, OPEN
Anesthesia: General | Site: Shoulder | Laterality: Right

## 2024-04-30 MED ORDER — EPHEDRINE 5 MG/ML INJ
INTRAVENOUS | Status: AC
  Filled 2024-04-30: qty 5

## 2024-04-30 MED ORDER — ACETAMINOPHEN 500 MG PO TABS
ORAL_TABLET | ORAL | Status: AC
  Filled 2024-04-30: qty 2

## 2024-04-30 MED ORDER — ONDANSETRON 4 MG PO TBDP: 4.0000 mg | ORAL_TABLET | Freq: Three times a day (TID) | ORAL | 0 refills | Status: AC | PRN

## 2024-04-30 MED ORDER — SUGAMMADEX SODIUM 200 MG/2ML IV SOLN
INTRAVENOUS | Status: DC | PRN
  Administered 2024-04-30: 200 mg via INTRAVENOUS

## 2024-04-30 MED ORDER — KETOROLAC TROMETHAMINE 30 MG/ML IJ SOLN: 30.0000 mg | Freq: Once | INTRAMUSCULAR | Status: DC | PRN

## 2024-04-30 MED ORDER — FENTANYL CITRATE (PF) 100 MCG/2ML IJ SOLN
INTRAMUSCULAR | Status: AC
  Filled 2024-04-30: qty 2

## 2024-04-30 MED ORDER — PROPOFOL 10 MG/ML IV BOLUS
INTRAVENOUS | Status: DC | PRN
  Administered 2024-04-30: 120 mg via INTRAVENOUS

## 2024-04-30 MED ORDER — LIDOCAINE 2% (20 MG/ML) 5 ML SYRINGE
INTRAMUSCULAR | Status: AC
  Filled 2024-04-30: qty 5

## 2024-04-30 MED ORDER — MIDAZOLAM HCL (PF) 2 MG/2ML IJ SOLN
2.0000 mg | Freq: Once | INTRAMUSCULAR | Status: AC
  Administered 2024-04-30: 2 mg via INTRAVENOUS

## 2024-04-30 MED ORDER — DEXMEDETOMIDINE HCL IN NACL 80 MCG/20ML IV SOLN
INTRAVENOUS | Status: AC
  Filled 2024-04-30: qty 20

## 2024-04-30 MED ORDER — FENTANYL CITRATE (PF) 100 MCG/2ML IJ SOLN
50.0000 ug | Freq: Once | INTRAMUSCULAR | Status: AC
  Administered 2024-04-30: 50 ug via INTRAVENOUS

## 2024-04-30 MED ORDER — LIDOCAINE HCL (CARDIAC) PF 100 MG/5ML IV SOSY
PREFILLED_SYRINGE | INTRAVENOUS | Status: DC | PRN
  Administered 2024-04-30: 80 mg via INTRATRACHEAL

## 2024-04-30 MED ORDER — DEXAMETHASONE SOD PHOSPHATE PF 10 MG/ML IJ SOLN
INTRAMUSCULAR | Status: DC | PRN
  Administered 2024-04-30: 10 mg via INTRAVENOUS

## 2024-04-30 MED ORDER — MELOXICAM 15 MG PO TABS: 15.0000 mg | ORAL_TABLET | Freq: Every day | ORAL | 0 refills | Status: AC | PRN

## 2024-04-30 MED ORDER — DEXMEDETOMIDINE HCL IN NACL 80 MCG/20ML IV SOLN
INTRAVENOUS | Status: DC | PRN
  Administered 2024-04-30: 8 ug via INTRAVENOUS

## 2024-04-30 MED ORDER — ROPIVACAINE HCL 5 MG/ML IJ SOLN
INTRAMUSCULAR | Status: DC | PRN
  Administered 2024-04-30: 30 mL via PERINEURAL

## 2024-04-30 MED ORDER — CEFAZOLIN SODIUM-DEXTROSE 2-4 GM/100ML-% IV SOLN
INTRAVENOUS | Status: AC
  Filled 2024-04-30: qty 100

## 2024-04-30 MED ORDER — OXYCODONE HCL 5 MG PO TABS: 5.0000 mg | ORAL_TABLET | Freq: Once | ORAL | Status: DC | PRN

## 2024-04-30 MED ORDER — ONDANSETRON HCL 4 MG/2ML IJ SOLN
INTRAMUSCULAR | Status: DC | PRN
  Administered 2024-04-30: 4 mg via INTRAVENOUS

## 2024-04-30 MED ORDER — LACTATED RINGERS IV SOLN: INTRAVENOUS | Status: DC

## 2024-04-30 MED ORDER — HYDROMORPHONE HCL 1 MG/ML IJ SOLN: 0.2500 mg | INTRAMUSCULAR | Status: DC | PRN

## 2024-04-30 MED ORDER — PROPOFOL 500 MG/50ML IV EMUL
INTRAVENOUS | Status: DC | PRN
  Administered 2024-04-30: 150 ug/kg/min via INTRAVENOUS

## 2024-04-30 MED ORDER — HYDROCODONE-ACETAMINOPHEN 10-325 MG PO TABS: 1.0000 | ORAL_TABLET | Freq: Four times a day (QID) | ORAL | 0 refills | Status: AC | PRN

## 2024-04-30 MED ORDER — CLONIDINE HCL (ANALGESIA) 100 MCG/ML EP SOLN
EPIDURAL | Status: DC | PRN
  Administered 2024-04-30: 100 ug

## 2024-04-30 MED ORDER — ONDANSETRON HCL 4 MG/2ML IJ SOLN: 4.0000 mg | Freq: Once | INTRAMUSCULAR | Status: DC | PRN

## 2024-04-30 MED ORDER — MIDAZOLAM HCL 2 MG/2ML IJ SOLN
INTRAMUSCULAR | Status: AC
  Filled 2024-04-30: qty 2

## 2024-04-30 MED ORDER — CEFAZOLIN SODIUM-DEXTROSE 2-4 GM/100ML-% IV SOLN
2.0000 g | INTRAVENOUS | Status: AC
  Administered 2024-04-30: 2 g via INTRAVENOUS

## 2024-04-30 MED ORDER — ONDANSETRON HCL 4 MG/2ML IJ SOLN
INTRAMUSCULAR | Status: AC
  Filled 2024-04-30: qty 2

## 2024-04-30 MED ORDER — ROCURONIUM BROMIDE 10 MG/ML (PF) SYRINGE
PREFILLED_SYRINGE | INTRAVENOUS | Status: AC
  Filled 2024-04-30: qty 10

## 2024-04-30 MED ORDER — FENTANYL CITRATE (PF) 100 MCG/2ML IJ SOLN
INTRAMUSCULAR | Status: DC | PRN
  Administered 2024-04-30: 50 ug via INTRAVENOUS

## 2024-04-30 MED ORDER — ACETAMINOPHEN 500 MG PO TABS
1000.0000 mg | ORAL_TABLET | Freq: Once | ORAL | Status: AC
  Administered 2024-04-30: 1000 mg via ORAL

## 2024-04-30 MED ORDER — POVIDONE-IODINE 10 % EX SWAB
2.0000 | Freq: Once | CUTANEOUS | Status: AC
  Administered 2024-04-30: 2 via TOPICAL

## 2024-04-30 MED ORDER — ROCURONIUM 10MG/ML (10ML) SYRINGE FOR MEDFUSION PUMP - OPTIME
INTRAVENOUS | Status: DC | PRN
  Administered 2024-04-30: 50 mg via INTRAVENOUS

## 2024-04-30 MED ORDER — EPHEDRINE SULFATE (PRESSORS) 25 MG/5ML IV SOSY
PREFILLED_SYRINGE | INTRAVENOUS | Status: DC | PRN
  Administered 2024-04-30 (×2): 10 mg via INTRAVENOUS

## 2024-04-30 MED ORDER — OXYCODONE HCL 5 MG/5ML PO SOLN: 5.0000 mg | Freq: Once | ORAL | Status: DC | PRN

## 2024-04-30 SURGICAL SUPPLY — 43 items
BLADE AVERAGE 25X9 (BLADE) ×1 IMPLANT
BLADE CLIPPER SURG (BLADE) IMPLANT
BLADE SURG 15 STRL LF DISP TIS (BLADE) ×1 IMPLANT
CHLORAPREP W/TINT 26 (MISCELLANEOUS) ×1 IMPLANT
CLSR STERI-STRIP ANTIMIC 1/2X4 (GAUZE/BANDAGES/DRESSINGS) ×1 IMPLANT
DRAPE IMP U-DRAPE 54X76 (DRAPES) ×1 IMPLANT
DRAPE U-SHAPE 47X51 STRL (DRAPES) ×1 IMPLANT
DRAPE U-SHAPE 76X120 STRL (DRAPES) ×2 IMPLANT
DRESSING MEPILEX FLEX 4X4 (GAUZE/BANDAGES/DRESSINGS) IMPLANT
DRSG EMULSION OIL 3X3 NADH (GAUZE/BANDAGES/DRESSINGS) IMPLANT
DRSG TEGADERM 4X4.75 (GAUZE/BANDAGES/DRESSINGS) IMPLANT
ELECTRODE REM PT RTRN 9FT ADLT (ELECTROSURGICAL) ×1 IMPLANT
GAUZE SPONGE 4X4 12PLY STRL (GAUZE/BANDAGES/DRESSINGS) ×1 IMPLANT
GLOVE BIO SURGEON STRL SZ7.5 (GLOVE) ×1 IMPLANT
GLOVE BIOGEL PI IND STRL 7.0 (GLOVE) ×1 IMPLANT
GLOVE BIOGEL PI IND STRL 8 (GLOVE) ×1 IMPLANT
GLOVE SURG SYN 7.0 PF PI (GLOVE) ×1 IMPLANT
GOWN STRL REUS W/ TWL LRG LVL3 (GOWN DISPOSABLE) ×2 IMPLANT
GOWN STRL REUS W/ TWL XL LVL3 (GOWN DISPOSABLE) ×1 IMPLANT
PACK ARTHROSCOPY DSU (CUSTOM PROCEDURE TRAY) ×1 IMPLANT
PACK BASIN DAY SURGERY FS (CUSTOM PROCEDURE TRAY) ×1 IMPLANT
PENCIL SMOKE EVACUATOR (MISCELLANEOUS) ×1 IMPLANT
SLEEVE SCD COMPRESS KNEE MED (STOCKING) ×1 IMPLANT
SLING ARM FOAM STRAP LRG (SOFTGOODS) IMPLANT
SOLN 0.9% NACL POUR BTL 1000ML (IV SOLUTION) ×1 IMPLANT
SPIKE FLUID TRANSFER (MISCELLANEOUS) IMPLANT
SPONGE T-LAP 18X18 ~~LOC~~+RFID (SPONGE) ×1 IMPLANT
STAPLER SKIN PROX WIDE 3.9 (STAPLE) IMPLANT
STRIP CLOSURE SKIN 1/2X4 (GAUZE/BANDAGES/DRESSINGS) IMPLANT
SUCTION TUBE FRAZIER 10FR DISP (SUCTIONS) IMPLANT
SUT ETHILON 3 0 PS 1 (SUTURE) IMPLANT
SUT MNCRL AB 3-0 PS2 18 (SUTURE) IMPLANT
SUT MNCRL AB 4-0 PS2 18 (SUTURE) IMPLANT
SUT MON AB 2-0 CT1 36 (SUTURE) IMPLANT
SUT RETRIEVER MED (INSTRUMENTS) IMPLANT
SUT VIC AB 0 CT1 27XBRD ANBCTR (SUTURE) IMPLANT
SUT VIC AB 2-0 SH 27XBRD (SUTURE) IMPLANT
SUT VIC AB 3-0 SH 27X BRD (SUTURE) IMPLANT
SUT VICRYL 0 SH 27 (SUTURE) ×1 IMPLANT
SUTURE FIBERWR #2 38 T-5 BLUE (SUTURE) IMPLANT
SYR BULB EAR ULCER 3OZ GRN STR (SYRINGE) ×1 IMPLANT
TOWEL GREEN STERILE FF (TOWEL DISPOSABLE) ×1 IMPLANT
YANKAUER SUCT BULB TIP NO VENT (SUCTIONS) ×1 IMPLANT

## 2024-04-30 NOTE — Transfer of Care (Signed)
 Immediate Anesthesia Transfer of Care Note  Patient: Mackenzie [name]  Procedure(s) Performed: EXCISION, CLAVICLE, DISTAL, OPEN (Right: Shoulder)  Patient Location: PACU  Anesthesia Type:General and Regional  Level of Consciousness: awake  Airway & Oxygen Therapy: Patient connected to nasal cannula oxygen  Post-op Assessment: Report given to RN and Post -op Vital signs reviewed and stable  Post vital signs: Reviewed and stable  Last Vitals:  Vitals Value Taken Time  BP 105/55 04/30/24 08:37  Temp    Pulse 85 04/30/24 08:38  Resp 24 04/30/24 08:38  SpO2 96 % 04/30/24 08:38  Vitals shown include unfiled device data.  Last Pain:  Vitals:   04/30/24 0630  TempSrc: Temporal  PainSc: 0-No pain      Patients Stated Pain Goal: 3 (04/30/24 0630)  Complications: No notable events documented.

## 2024-04-30 NOTE — Anesthesia Procedure Notes (Signed)
 Procedure Name: Intubation Date/Time: 04/30/2024 7:47 AM  Performed by: Lenard Lacks, CRNAPre-anesthesia Checklist: Patient identified, Emergency Drugs available, Suction available and Patient being monitored Patient Re-evaluated:Patient Re-evaluated prior to induction Oxygen Delivery Method: Circle system utilized Preoxygenation: Pre-oxygenation with 100% oxygen Induction Type: IV induction Ventilation: Mask ventilation without difficulty Laryngoscope Size: Miller and 2 Grade View: Grade I Tube type: Oral Number of attempts: 1 Airway Equipment and Method: Stylet and Oral airway Placement Confirmation: ETT inserted through vocal cords under direct vision, positive ETCO2 and breath sounds checked- equal and bilateral Tube secured with: Tape Dental Injury: Teeth and Oropharynx as per pre-operative assessment

## 2024-04-30 NOTE — Progress Notes (Signed)
Assisted Dr. Valma Cava with right, interscalene , ultrasound guided block. Side rails up, monitors on throughout procedure. See vital signs in flow sheet. Tolerated Procedure well.

## 2024-04-30 NOTE — Discharge Instructions (Addendum)
 POST-OPERATIVE OPIOID TAPER INSTRUCTIONS: It is important to wean off of your opioid medication as soon as possible. If you do not need pain medication after your surgery it is ok to stop day one. Opioids include: Codeine, Hydrocodone (Norco, Vicodin), Oxycodone (Percocet, oxycontin ) and hydromorphone  amongst others.  Long term and even short term use of opiods can cause: Increased pain response Dependence Constipation Depression Respiratory depression And more.  Withdrawal symptoms can include Flu like symptoms Nausea, vomiting And more Techniques to manage these symptoms Hydrate well Eat regular healthy meals Stay active Use relaxation techniques(deep breathing, meditating, yoga) Do Not substitute Alcohol to help with tapering If you have been on opioids for less than two weeks and do not have pain than it is ok to stop all together.  Plan to wean off of opioids This plan should start within one week post op of your joint replacement. Maintain the same interval or time between taking each dose and first decrease the dose.  Cut the total daily intake of opioids by one tablet each day Next start to increase the time between doses. The last dose that should be eliminated is the evening dose.    No Tylenol  until after 12:40pm today, if needed.   Post Anesthesia Home Care Instructions  Activity: Get plenty of rest for the remainder of the day. A responsible individual must stay with you for 24 hours following the procedure.  For the next 24 hours, DO NOT: -Drive a car -Advertising copywriter -Drink alcoholic beverages -Take any medication unless instructed by your physician -Make any legal decisions or sign important papers.  Meals: Start with liquid foods such as gelatin or soup. Progress to regular foods as tolerated. Avoid greasy, spicy, heavy foods. If nausea and/or vomiting occur, drink only clear liquids until the nausea and/or vomiting subsides. Call your physician if  vomiting continues.  Special Instructions/Symptoms: Your throat may feel dry or sore from the anesthesia or the breathing tube placed in your throat during surgery. If this causes discomfort, gargle with warm salt water. The discomfort should disappear within 24 hours.  If you had a scopolamine patch placed behind your ear for the management of post- operative nausea and/or vomiting:  1. The medication in the patch is effective for 72 hours, after which it should be removed.  Wrap patch in a tissue and discard in the trash. Wash hands thoroughly with soap and water. 2. You may remove the patch earlier than 72 hours if you experience unpleasant side effects which may include dry mouth, dizziness or visual disturbances. 3. Avoid touching the patch. Wash your hands with soap and water after contact with the patch.   Regional Anesthesia Blocks  1. You may not be able to move or feel the blocked extremity after a regional anesthetic block. This may last may last from 3-48 hours after placement, but it will go away. The length of time depends on the medication injected and your individual response to the medication. As the nerves start to wake up, you may experience tingling as the movement and feeling returns to your extremity. If the numbness and inability to move your extremity has not gone away after 48 hours, please call your surgeon.   2. The extremity that is blocked will need to be protected until the numbness is gone and the strength has returned. Because you cannot feel it, you will need to take extra care to avoid injury. Because it may be weak, you may have difficulty moving it or using  it. You may not know what position it is in without looking at it while the block is in effect.  3. For blocks in the legs and feet, returning to weight bearing and walking needs to be done carefully. You will need to wait until the numbness is entirely gone and the strength has returned. You should be able to  move your leg and foot normally before you try and bear weight or walk. You will need someone to be with you when you first try to ensure you do not fall and possibly risk injury.  4. Bruising and tenderness at the needle site are common side effects and will resolve in a few days.  5. Persistent numbness or new problems with movement should be communicated to the surgeon or the [facility] [phone] [facility] [phone]).

## 2024-04-30 NOTE — Anesthesia Procedure Notes (Signed)
 Anesthesia Regional Block: Interscalene brachial plexus block   Pre-Anesthetic Checklist: , timeout performed,  Correct Patient, Correct Site, Correct Laterality,  Correct Procedure, Correct Position, site marked,  Risks and benefits discussed,  Surgical consent,  Pre-op evaluation,  At surgeon's request and post-op pain management  Laterality: Upper and Right  Prep: Maximum Sterile Barrier Precautions used, chloraprep       Needles:  Injection technique: Single-shot  Needle Type: Echogenic Needle     Needle Length: 5cm  Needle Gauge: 21     Additional Needles:   Procedures:,,,, ultrasound used (permanent image in chart),,    Narrative:  Start time: 04/30/2024 6:59 AM End time: 04/30/2024 7:02 AM Injection made incrementally with aspirations every 5 mL.  Performed by: Personally  Anesthesiologist: Jefm Garnette LABOR, MD  Additional Notes: Block assessed prior to procedure. Patient tolerated procedure well.

## 2024-04-30 NOTE — Interval H&P Note (Signed)
 History and Physical Interval Note:  04/30/2024 7:13 AM  Mackenzie [name]  has presented today for surgery, with the diagnosis of OA RIGHT SHOULDER.  The various methods of treatment have been discussed with the patient and family. After consideration of risks, benefits and other options for treatment, the patient has consented to  Procedure(s): EXCISION, CLAVICLE, DISTAL, OPEN (Right) as a surgical intervention.  The patient's history has been reviewed, patient examined, no change in status, stable for surgery.  I have reviewed the patient's chart and labs.  Questions were answered to the patient's satisfaction.     Mackenzie [name]

## 2024-04-30 NOTE — Anesthesia Postprocedure Evaluation (Signed)
 Anesthesia Post Note  Patient: Mackenzie [name]  Procedure(s) Performed: EXCISION, CLAVICLE, DISTAL, OPEN (Right: Shoulder)     Patient location during evaluation: PACU Anesthesia Type: General and Regional Level of consciousness: awake and alert Pain management: pain level controlled Vital Signs Assessment: post-procedure vital signs reviewed and stable Respiratory status: spontaneous breathing, nonlabored ventilation, respiratory function stable and patient connected to nasal cannula oxygen Cardiovascular status: blood pressure returned to baseline and stable Postop Assessment: no apparent nausea or vomiting Anesthetic complications: no   No notable events documented.  Last Vitals:  Vitals:   04/30/24 0915 04/30/24 0929  BP: (!) 91/57 101/60  Pulse: 86 74  Resp: 17 18  Temp:  (!) 36.2 C  SpO2: 93% 94%    Last Pain:  Vitals:   04/30/24 0929  TempSrc:   PainSc: 0-No pain                 [name]

## 2024-04-30 NOTE — Op Note (Signed)
 04/30/2024  8:36 AM  PATIENT:  Mackenzie [name]    PRE-OPERATIVE DIAGNOSIS:  OA RIGHT SHOULDER  POST-OPERATIVE DIAGNOSIS:  Same  PROCEDURE:  EXCISION, CLAVICLE, DISTAL, OPEN  SURGEON:  Evalene JONETTA Chancy, MD  ASSISTANT: Gerard Large, PA-C, he was present and scrubbed throughout the case, critical for completion in a timely fashion, and for retraction, instrumentation, and closure.   ANESTHESIA:   gen  PREOPERATIVE INDICATIONS:  [name] is a  43 y.o. female with a diagnosis of OA RIGHT SHOULDER who failed conservative measures and elected for surgical management.    The risks benefits and alternatives were discussed with the patient preoperatively including but not limited to the risks of infection, bleeding, nerve injury, cardiopulmonary complications, the need for revision surgery, among others, and the patient was willing to proceed.  OPERATIVE IMPLANTS: none  OPERATIVE FINDINGS: dc excisoin  BLOOD LOSS: min  COMPLICATIONS: none  TOURNIQUET TIME: none  OPERATIVE PROCEDURE:  Patient was identified in the preoperative holding area and site was marked by me She was transported to the operating theater and placed on the table in supine position taking care to pad all bony prominences. After a preincinduction time out anesthesia was induced. The right upper extremity was prepped and draped in normal sterile fashion and a pre-incision timeout was performed. She received ancef  for preoperative antibiotics.   Was placed in the beachchair position again padding all bony prominences the right upper extremity was prepped and draped  I made an incision over her distal clavicle and dissected to the periosteum here I incised the joint capsule and periosteum exposing her distal clavicle next I used an ACL sawblade to remove a centimeter of her distal clavicle and beveled the edges to not leave any prominent corners  I then performed performed a thorough irrigation and maintain hemostasis  close the capsule and the soft tissue in layers  POST OPERATIVE PLAN: Sling for comfort advance activity as able

## 2024-05-01 ENCOUNTER — Encounter (HOSPITAL_BASED_OUTPATIENT_CLINIC_OR_DEPARTMENT_OTHER): Payer: Self-pay | Admitting: Orthopedic Surgery
